# Patient Record
Sex: Female | Born: 1955 | Race: White | Hispanic: No | Marital: Single | Smoking: Former smoker
Health system: Southern US, Community
[De-identification: ages and names within clinical notes are randomized; demographics above are authoritative.]

## PROBLEM LIST (undated history)

## (undated) DIAGNOSIS — M549 Dorsalgia, unspecified: Secondary | ICD-10-CM

## (undated) DIAGNOSIS — F4310 Post-traumatic stress disorder, unspecified: Secondary | ICD-10-CM

## (undated) DIAGNOSIS — S199XXA Unspecified injury of neck, initial encounter: Secondary | ICD-10-CM

## (undated) DIAGNOSIS — F410 Panic disorder [episodic paroxysmal anxiety] without agoraphobia: Secondary | ICD-10-CM

## (undated) DIAGNOSIS — Z5900 Homelessness unspecified: Secondary | ICD-10-CM

## (undated) DIAGNOSIS — I2699 Other pulmonary embolism without acute cor pulmonale: Secondary | ICD-10-CM

## (undated) DIAGNOSIS — F419 Anxiety disorder, unspecified: Secondary | ICD-10-CM

## (undated) DIAGNOSIS — G4700 Insomnia, unspecified: Secondary | ICD-10-CM

## (undated) DIAGNOSIS — J189 Pneumonia, unspecified organism: Secondary | ICD-10-CM

## (undated) DIAGNOSIS — F1910 Other psychoactive substance abuse, uncomplicated: Secondary | ICD-10-CM

## (undated) HISTORY — PX: TONSILLECTOMY: SUR1361

---

## 2012-08-27 DIAGNOSIS — J189 Pneumonia, unspecified organism: Secondary | ICD-10-CM

## 2012-08-27 HISTORY — DX: Pneumonia, unspecified organism: J18.9

## 2013-08-27 DIAGNOSIS — I2699 Other pulmonary embolism without acute cor pulmonale: Secondary | ICD-10-CM

## 2013-08-27 HISTORY — DX: Other pulmonary embolism without acute cor pulmonale: I26.99

## 2014-09-27 LAB — HM MAMMOGRAPHY

## 2014-10-23 ENCOUNTER — Emergency Department: Payer: Self-pay | Admitting: Internal Medicine

## 2014-11-02 ENCOUNTER — Encounter (HOSPITAL_COMMUNITY): Payer: Self-pay | Admitting: Emergency Medicine

## 2014-11-02 ENCOUNTER — Emergency Department (HOSPITAL_COMMUNITY)
Admission: EM | Admit: 2014-11-02 | Discharge: 2014-11-02 | Disposition: A | Discharge status: Home / Self Care | Payer: Self-pay | Source: Home / Self Care | Attending: Family Medicine | Admitting: Family Medicine

## 2014-11-02 DIAGNOSIS — M6249 Contracture of muscle, multiple sites: Secondary | ICD-10-CM

## 2014-11-02 DIAGNOSIS — M62838 Other muscle spasm: Secondary | ICD-10-CM

## 2014-11-02 HISTORY — DX: Anxiety disorder, unspecified: F41.9

## 2014-11-02 HISTORY — DX: Unspecified injury of neck, initial encounter: S19.9XXA

## 2014-11-02 HISTORY — DX: Dorsalgia, unspecified: M54.9

## 2014-11-02 HISTORY — DX: Insomnia, unspecified: G47.00

## 2014-11-02 MED ORDER — CYCLOBENZAPRINE HCL 5 MG PO TABS: 5.0000 mg | ORAL_TABLET | Freq: Three times a day (TID) | ORAL | Status: DC | PRN

## 2014-11-02 NOTE — Discharge Instructions (Signed)
Thank you for coming in today. Come back or go to the emergency room if you notice new weakness new numbness problems walking or bowel or bladder problems. Follow up with Physical Therapy and [facility].  Muscle Cramps and Spasms Muscle cramps and spasms occur when a muscle or muscles tighten and you have no control over this tightening (involuntary muscle contraction). They are a common problem and can develop in any muscle. The most common place is in the calf muscles of the leg. Both muscle cramps and muscle spasms are involuntary muscle contractions, but they also have differences:   Muscle cramps are sporadic and painful. They may last a few seconds to a quarter of an hour. Muscle cramps are often more forceful and last longer than muscle spasms.  Muscle spasms may or may not be painful. They may also last just a few seconds or much longer. CAUSES  It is uncommon for cramps or spasms to be due to a serious underlying problem. In many cases, the cause of cramps or spasms is unknown. Some common causes are:   Overexertion.   Overuse from repetitive motions (doing the same thing over and over).   Remaining in a certain position for a long period of time.   Improper preparation, form, or technique while performing a sport or activity.   Dehydration.   Injury.   Side effects of some medicines.   Abnormally low levels of the salts and ions in your blood (electrolytes), especially potassium and calcium. This could happen if you are taking water pills (diuretics) or you are pregnant.  Some underlying medical problems can make it more likely to develop cramps or spasms. These include, but are not limited to:   Diabetes.   Parkinson disease.   Hormone disorders, such as thyroid problems.   Alcohol abuse.   Diseases specific to muscles, joints, and bones.   Blood vessel disease where not enough blood is getting to the muscles.  HOME CARE INSTRUCTIONS   Stay  well hydrated. Drink enough water and fluids to keep your urine clear or pale yellow.  It may be helpful to massage, stretch, and relax the affected muscle.  For tight or tense muscles, use a warm towel, heating pad, or hot shower water directed to the affected area.  If you are sore or have pain after a cramp or spasm, applying ice to the affected area may relieve discomfort.  Put ice in a plastic bag.  Place a towel between your skin and the bag.  Leave the ice on for 15-20 minutes, 03-04 times a day.  Medicines used to treat a known cause of cramps or spasms may help reduce their frequency or severity. Only take over-the-counter or prescription medicines as directed by your caregiver. SEEK MEDICAL CARE IF:  Your cramps or spasms get more severe, more frequent, or do not improve over time.  MAKE SURE YOU:   Understand these instructions.  Will watch your condition.  Will get help right away if you are not doing well or get worse. Document Released: 02/02/2002 Document Revised: 12/08/2012 Document Reviewed: 07/30/2012 [facility] Patient Information 2015 [employer] [place]. This information is not intended to replace advice given to you by your health care provider. Make sure you discuss any questions you have with your health care provider.
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
## 2014-11-16 ENCOUNTER — Emergency Department: Payer: Self-pay | Admitting: Emergency Medicine

## 2014-11-27 ENCOUNTER — Encounter (HOSPITAL_COMMUNITY): Payer: Self-pay | Admitting: Emergency Medicine

## 2014-11-27 ENCOUNTER — Emergency Department (HOSPITAL_COMMUNITY)
Admission: EM | Admit: 2014-11-27 | Discharge: 2014-11-27 | Discharge status: Against Medical Advice | Payer: Medicaid Other | Attending: Emergency Medicine | Admitting: Emergency Medicine

## 2014-11-27 DIAGNOSIS — Y998 Other external cause status: Secondary | ICD-10-CM | POA: Diagnosis not present

## 2014-11-27 DIAGNOSIS — Z86711 Personal history of pulmonary embolism: Secondary | ICD-10-CM | POA: Diagnosis not present

## 2014-11-27 DIAGNOSIS — Y9389 Activity, other specified: Secondary | ICD-10-CM | POA: Insufficient documentation

## 2014-11-27 DIAGNOSIS — S6991XA Unspecified injury of right wrist, hand and finger(s), initial encounter: Secondary | ICD-10-CM | POA: Diagnosis not present

## 2014-11-27 DIAGNOSIS — S8992XA Unspecified injury of left lower leg, initial encounter: Secondary | ICD-10-CM | POA: Diagnosis present

## 2014-11-27 DIAGNOSIS — S6992XA Unspecified injury of left wrist, hand and finger(s), initial encounter: Secondary | ICD-10-CM | POA: Diagnosis not present

## 2014-11-27 DIAGNOSIS — Z8669 Personal history of other diseases of the nervous system and sense organs: Secondary | ICD-10-CM | POA: Diagnosis not present

## 2014-11-27 DIAGNOSIS — Z720 Tobacco use: Secondary | ICD-10-CM | POA: Diagnosis not present

## 2014-11-27 DIAGNOSIS — W19XXXA Unspecified fall, initial encounter: Secondary | ICD-10-CM

## 2014-11-27 DIAGNOSIS — Y92019 Unspecified place in single-family (private) house as the place of occurrence of the external cause: Secondary | ICD-10-CM | POA: Diagnosis not present

## 2014-11-27 DIAGNOSIS — Z8659 Personal history of other mental and behavioral disorders: Secondary | ICD-10-CM | POA: Diagnosis not present

## 2014-11-27 DIAGNOSIS — S80212A Abrasion, left knee, initial encounter: Secondary | ICD-10-CM | POA: Diagnosis not present

## 2014-11-27 HISTORY — DX: Other pulmonary embolism without acute cor pulmonale: I26.99

## 2014-11-27 MED ORDER — LORAZEPAM 2 MG/ML IJ SOLN: 1.0000 mg | Freq: Once | INTRAMUSCULAR | Status: DC

## 2014-11-27 MED ORDER — CLONAZEPAM 0.5 MG PO TABS: 1.0000 mg | ORAL_TABLET | Freq: Once | ORAL | Status: DC

## 2014-11-27 MED ORDER — CLONAZEPAM 0.5 MG PO TABS
1.0000 mg | ORAL_TABLET | Freq: Once | ORAL | Status: AC
  Administered 2014-11-27: 1 mg via ORAL
  Filled 2014-11-27: qty 2

## 2014-11-27 MED ORDER — LORAZEPAM 1 MG PO TABS: 1.0000 mg | ORAL_TABLET | Freq: Once | ORAL | Status: DC

## 2014-11-27 NOTE — Progress Notes (Addendum)
ED CM received [facility] patient, reviewing record, ED CSW consulted regarding patient's complaints.

## 2014-11-27 NOTE — ED Notes (Signed)
Pt refusing xray, states "i've had too many xrays in my life".  Dr Rubin Payorpickering notified.

## 2014-11-27 NOTE — ED Notes (Signed)
Pt yelling at staff, threatening to leave, attempting to leave room.  Pt repeatedly stating that we are not helping her.  Told patient that we were trying to help her, that we had notified social work of her status, that we were trying to xray her, that she was refusing treatment and that we were therefore unable to help her.
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
## 2014-11-27 NOTE — ED Notes (Signed)
Social worker Simonne ComeLeo notified of patient complaint.  Leo at bedside at this time
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
## 2014-11-27 NOTE — ED Notes (Signed)
Pt continues to yell at staff, but agrees to have xrays performed.  Dr Rubin Payorpickering notified
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
## 2014-11-27 NOTE — ED Notes (Signed)
ASSESSMENT: Pt awake, alert, oriented.  Tearful, but coherent.   Reports generalized pain "everywhere".  Will not articulate particular area of pain.  Pt flinches and moans upon contact with any part of body.   Patient noted to have abrasions to left knee and to bilateral hands. No other bruises, abrasions noted at this time.   Pt noted to have clear breath sounds, oxygen level 98% Pt blood pressure noted to be 117/78. Pt reassured of safety, will notify social work.

## 2014-11-27 NOTE — ED Notes (Signed)
Went into room to assess patient, pt and belongings no longer in room.
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
## 2014-11-28 ENCOUNTER — Emergency Department (HOSPITAL_COMMUNITY)
Admission: EM | Admit: 2014-11-28 | Discharge: 2014-11-29 | Disposition: A | Discharge status: Home / Self Care | Payer: Medicaid Other | Attending: Emergency Medicine | Admitting: Emergency Medicine

## 2014-11-28 ENCOUNTER — Encounter (HOSPITAL_COMMUNITY): Payer: Self-pay | Admitting: *Deleted

## 2014-11-28 DIAGNOSIS — Z8669 Personal history of other diseases of the nervous system and sense organs: Secondary | ICD-10-CM | POA: Diagnosis not present

## 2014-11-28 DIAGNOSIS — Z720 Tobacco use: Secondary | ICD-10-CM | POA: Insufficient documentation

## 2014-11-28 DIAGNOSIS — Z86711 Personal history of pulmonary embolism: Secondary | ICD-10-CM | POA: Insufficient documentation

## 2014-11-28 DIAGNOSIS — F1210 Cannabis abuse, uncomplicated: Secondary | ICD-10-CM | POA: Diagnosis not present

## 2014-11-28 DIAGNOSIS — F39 Unspecified mood [affective] disorder: Secondary | ICD-10-CM | POA: Insufficient documentation

## 2014-11-28 DIAGNOSIS — Z87828 Personal history of other (healed) physical injury and trauma: Secondary | ICD-10-CM | POA: Insufficient documentation

## 2014-11-28 DIAGNOSIS — Z008 Encounter for other general examination: Secondary | ICD-10-CM | POA: Diagnosis present

## 2014-11-28 DIAGNOSIS — G8929 Other chronic pain: Secondary | ICD-10-CM | POA: Diagnosis not present

## 2014-11-28 DIAGNOSIS — R45851 Suicidal ideations: Secondary | ICD-10-CM

## 2014-11-28 DIAGNOSIS — F419 Anxiety disorder, unspecified: Secondary | ICD-10-CM | POA: Insufficient documentation

## 2014-11-28 DIAGNOSIS — F1310 Sedative, hypnotic or anxiolytic abuse, uncomplicated: Secondary | ICD-10-CM | POA: Insufficient documentation

## 2014-11-28 LAB — COMPREHENSIVE METABOLIC PANEL
ALK PHOS: 180 U/L — AB (ref 39–117)
ALT: 163 U/L — AB (ref 0–35)
AST: 100 U/L — AB (ref 0–37)
Albumin: 4.4 g/dL (ref 3.5–5.2)
Anion gap: 11 (ref 5–15)
BUN: 17 mg/dL (ref 6–23)
CO2: 25 mmol/L (ref 19–32)
Calcium: 9.6 mg/dL (ref 8.4–10.5)
Chloride: 104 mmol/L (ref 96–112)
Creatinine, Ser: 0.89 mg/dL (ref 0.50–1.10)
GFR calc Af Amer: 80 mL/min — ABNORMAL LOW (ref >90)
GFR, EST NON AFRICAN AMERICAN: 69 mL/min — AB (ref >90)
GLUCOSE: 166 mg/dL — AB (ref 70–99)
POTASSIUM: 3.9 mmol/L (ref 3.5–5.1)
SODIUM: 140 mmol/L (ref 135–145)
Total Bilirubin: 0.8 mg/dL (ref 0.3–1.2)
Total Protein: 7.7 g/dL (ref 6.0–8.3)

## 2014-11-28 LAB — ACETAMINOPHEN LEVEL: Acetaminophen (Tylenol), Serum: <10 ug/mL — ABNORMAL LOW (ref 10–30)

## 2014-11-28 LAB — SALICYLATE LEVEL: Salicylate Lvl: <4 mg/dL (ref 2.8–20.0)

## 2014-11-28 LAB — CBC
HCT: 42.9 % (ref 36.0–46.0)
HEMOGLOBIN: 14.6 g/dL (ref 12.0–15.0)
MCH: 30.7 pg (ref 26.0–34.0)
MCHC: 34 g/dL (ref 30.0–36.0)
MCV: 90.3 fL (ref 78.0–100.0)
PLATELETS: 181 10*3/uL (ref 150–400)
RBC: 4.75 MIL/uL (ref 3.87–5.11)
RDW: 14 % (ref 11.5–15.5)
WBC: 5.6 10*3/uL (ref 4.0–10.5)

## 2014-11-28 LAB — RAPID URINE DRUG SCREEN, HOSP PERFORMED
Amphetamines: NOT DETECTED
BARBITURATES: NOT DETECTED
BENZODIAZEPINES: POSITIVE — AB
Cocaine: NOT DETECTED
Opiates: NOT DETECTED
TETRAHYDROCANNABINOL: POSITIVE — AB

## 2014-11-28 LAB — ETHANOL

## 2014-11-28 MED ORDER — STERILE WATER FOR INJECTION IJ SOLN
INTRAMUSCULAR | Status: AC
  Filled 2014-11-28: qty 10

## 2014-11-28 MED ORDER — ALUM & MAG HYDROXIDE-SIMETH 200-200-20 MG/5ML PO SUSP: 30.0000 mL | ORAL | Status: DC | PRN

## 2014-11-28 MED ORDER — ACETAMINOPHEN 325 MG PO TABS: 650.0000 mg | ORAL_TABLET | ORAL | Status: DC | PRN

## 2014-11-28 MED ORDER — ONDANSETRON HCL 4 MG PO TABS: 4.0000 mg | ORAL_TABLET | Freq: Three times a day (TID) | ORAL | Status: DC | PRN

## 2014-11-28 MED ORDER — HALOPERIDOL 2 MG PO TABS
2.0000 mg | ORAL_TABLET | Freq: Once | ORAL | Status: DC
  Filled 2014-11-28: qty 1

## 2014-11-28 MED ORDER — ZIPRASIDONE MESYLATE 20 MG IM SOLR
10.0000 mg | Freq: Once | INTRAMUSCULAR | Status: AC
  Administered 2014-11-28: 10 mg via INTRAMUSCULAR
  Filled 2014-11-28: qty 20

## 2014-11-28 MED ORDER — NICOTINE 21 MG/24HR TD PT24
21.0000 mg | MEDICATED_PATCH | Freq: Every day | TRANSDERMAL | Status: DC
  Filled 2014-11-28: qty 1

## 2014-11-28 MED ORDER — TRAZODONE HCL 100 MG PO TABS
100.0000 mg | ORAL_TABLET | Freq: Every evening | ORAL | Status: DC | PRN
  Filled 2014-11-28: qty 1

## 2014-11-28 MED ORDER — LORAZEPAM 1 MG PO TABS
1.0000 mg | ORAL_TABLET | Freq: Three times a day (TID) | ORAL | Status: DC | PRN
  Administered 2014-11-28 – 2014-11-29 (×2): 1 mg via ORAL
  Filled 2014-11-28 (×2): qty 1

## 2014-11-28 MED ORDER — HYDROXYZINE HCL 25 MG PO TABS
50.0000 mg | ORAL_TABLET | Freq: Four times a day (QID) | ORAL | Status: DC | PRN
  Administered 2014-11-28 – 2014-11-29 (×2): 50 mg via ORAL
  Filled 2014-11-28 (×2): qty 2

## 2014-11-28 NOTE — ED Notes (Signed)
Resting with eyes closed. Acuity low.

## 2014-11-28 NOTE — ED Notes (Signed)
Patient at nurses station arguing that Vistaril is not helping. Reminded that she only took the medication PO 15 minutes ago. Offered patient distraction tools and encouragement. Patient reports nothing will help but her medications and refuses to try anything. Patient returned to room yelling and slamming doors.   Q 15 safety checks continue.

## 2014-11-28 NOTE — ED Notes (Signed)
Patient irritable and arguementative.  Acuity moderate.

## 2014-11-28 NOTE — ED Notes (Addendum)
Patient is irritable and demanding. Reports that Klonopin aids her anxiety and Restoril helps her sleep. Patient requests Ativan at present as "I am getting angry". Patient made aware of medication orders. Patient reports that receiving Geodon agitated her and made her want to harm others. Denies SI, HI, AVH at present.   Encouragement offered.   Q 15 safety checks in place.   Annette Montgomery. [place] NP made aware of patient status.
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
## 2014-11-28 NOTE — ED Notes (Signed)
Acuity note- Patient is angry and demanding.  Threw her dinner tray and drink across room.  Refused to clean it up. "Leave it there! I'm very angry!"  Initially declined ativan,demanding clonopin. Then, took ativan.Refused to put scrubs on. Laying with sheet covering her body.  Belligerent affect.
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
## 2014-11-28 NOTE — ED Notes (Signed)
After speaking with Doran StablerNwaeze NP and Clinical research associatewriter, patient willing to try Vistaril. Remains argumentative, irritable.   Q 15 safety checks continue.

## 2014-11-28 NOTE — ED Notes (Signed)
Informed patient that she would be able to discuss her medications in the am with provider. Continues to be demanding.  Compassionate boundaries enforced.

## 2014-11-28 NOTE — ED Notes (Addendum)
Patient has jean jacket, black tank top, jeans, leggings, black socks, boots and one shoelace necklace. Items are in 2 patient belonging bags. Patient has been wanded by security. Belongings are at nursing station by patients room.   Patient is verbally abusive to staff and yelling at staff members to get out of her room.

## 2014-11-28 NOTE — ED Notes (Signed)
Patient offered Vistaril and Trazodone for c/o anxiety, agitation, and insomnia. Patient refuses medications at present. States she does not like Trazodone.   Encouragement offered. Water provided.  Q 15 safety checks continue.

## 2014-11-28 NOTE — ED Notes (Signed)
Patient at nurses station. Requesting medication and that a provider be called. Reports feelings of anxiety attack. Continues to refuse to try Vistaril and/or Trazodone to try and help her relax and get to sleep.   Nwaeze NP made aware.

## 2014-11-28 NOTE — ED Notes (Addendum)
EMS reports [facility] called EMS due to pt having a :"Panic Attack" pt did receive morning dose of Methadone, she is presently crying and will not stop. VS 104-150/90 CBG 154. Pt stated to EMS multiple times "I don't want to be hear and want to die."

## 2014-11-28 NOTE — ED Notes (Signed)
Bed: [id number] Expected date: 11/28/14 Expected time: 8:23 AM Means of arrival: Ambulance Comments: Medical Clearance S/I

## 2014-11-29 DIAGNOSIS — F39 Unspecified mood [affective] disorder: Secondary | ICD-10-CM | POA: Diagnosis present

## 2014-11-29 MED ORDER — TEMAZEPAM 15 MG PO CAPS
30.0000 mg | ORAL_CAPSULE | Freq: Once | ORAL | Status: AC
  Administered 2014-11-29: 30 mg via ORAL
  Filled 2014-11-29: qty 2

## 2014-11-29 NOTE — ED Notes (Signed)
Patient reports that she placed Ativan in her water and then poured it in the sink.

## 2014-11-29 NOTE — ED Notes (Signed)
Currently calm and cooperative. Hopeful for D/C. Safety has been maintained with q15 min sfaty checks. Will continue current POC. Acuity moderate

## 2014-11-29 NOTE — ED Notes (Signed)
Patient irritable, argumentative. Wants pain medication other than Tylenol. NP speaking with patient.

## 2014-11-29 NOTE — Progress Notes (Signed)
CM spoke with pt who [place]. CM discussed and provided written information for self pay pcps, importance of pcp for f/u care, www.needymeds.org, www.goodrx.com, discounted pharmacies and other [place] resources such as [facility], [facility], affordable care act,  financial assistance, DSS and  health department  Reviewed resources for [place] self pay pcps like [facility], family medicine at [address], [facility] family [facility], general medical clinics, [facility] urgent care plus others, medication resources, [facility] out patient pharmacies and housing Pt voiced understanding and appreciation of resources provided   Provided [facility] contact information Pt [place] Pt encouraged to access [facility] after [place]  Pt also given information on open door ministries [place]
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
## 2014-11-29 NOTE — [facility] Suicide Risk Assessment (Cosign Needed)
Suicide Risk Assessment  Discharge Assessment   [facility]   Demographic Factors:  Caucasian, Low socioeconomic status and Unemployed  Total Time spent with patient: 20 minutes  Musculoskeletal: Strength & Muscle Tone: within normal limits Gait & Station: normal Patient leans: N/A  Psychiatric Specialty Exam:     Blood pressure 103/63, pulse 78, temperature 98.3 F (36.8 C), temperature source Oral, resp. rate 16, SpO2 100 %.There is no height or weight on file to calculate BMI.  General Appearance: Casual and Disheveled  Eye Contact::  Good  Speech:  Clear and Coherent and Normal Rate409  Volume:  Normal  Mood:  Angry and Anxious  Affect:  Congruent  Thought Process:  Coherent, Goal Directed and Intact  Orientation:  Full (Time, Place, and Person)  Thought Content:  WDL  Suicidal Thoughts:  No  Homicidal Thoughts:  No  Memory:  Immediate;   Good Recent;   Good Remote;   Good  Judgement:  Fair  Insight:  Good  Psychomotor Activity:  Normal  Concentration:  Good  Recall:  NA  Fund of Knowledge:Fair  Language: Good  Akathisia:  NA  Handed:  Right  AIMS (if indicated):     Assets:  Communication Skills Desire for Improvement  Sleep:     Cognition: WNL  ADL's:  Impaired      Has this patient used any form of tobacco in the last 30 days? (Cigarettes, Smokeless Tobacco, Cigars, and/or Pipes) Yes, A prescription for an FDA-approved tobacco cessation medication was offered at discharge and the patient refused  Mental Status Per Nursing Assessment::   On Admission:     Current Mental Status by Physician: NA  Loss Factors: Financial problems/change in socioeconomic status  Historical Factors: NA  Risk Reduction Factors:   NA  Continued Clinical Symptoms:  Severe Anxiety and/or Agitation  Cognitive Features That Contribute To Risk:  Polarized thinking    Suicide Risk:  Minimal: No identifiable suicidal ideation.  Patients  presenting with no risk factors but with morbid ruminations; may be classified as minimal risk based on the severity of the depressive symptoms  Principal Problem: Mood disorder Discharge Diagnoses:  Patient Active Problem List   Diagnosis Date Noted  . Mood disorder [F39] 11/29/2014    Priority: High      Plan Of Care/Follow-up recommendations:  Activity:  AS TOLERATED Diet:  REGULAR  Is patient on multiple antipsychotic therapies at discharge:  No   Has Patient had three or more failed trials of antipsychotic monotherapy by history:  No  Recommended Plan for Multiple Antipsychotic Therapies: NA    Symphoni Helbling C   PMHNP-BC 11/29/2014, 1:35 PM

## 2014-11-29 NOTE — ED Notes (Signed)
Patient up, asking for methadone. Reminded that psychiatry would round around 9am.

## 2014-11-29 NOTE — ED Notes (Signed)
Patient awake stating medication given did not work.

## 2014-11-29 NOTE — ED Notes (Signed)
Patient at nurses station again asking for methadone. When told again when psychiatry would be rounding, patient then asked for Xanax. Advised that this was not ordered for her. Patient then asked for Ativan. Advised that this was ordered Q8. Patient declines offer of Vistaril. Encouraged to try to calm herself and rest on her own.

## 2014-11-29 NOTE — ED Notes (Signed)
Patient requesting medication at 2335. Offered prn Ativan. Patient refuses, asks that I speak to provider about Klonopin.   After speaking with NP, writer returned to patient's room. Patient sleeping; eyes closed respirations even and unlabored. Will update patient when she wakes.  Q 15 safety checks in place.

## 2014-11-29 NOTE — ED Notes (Addendum)
Patient is being argumentative, irritable. Upset that she is not ordered Klonopin. States Ativan does not work. Then requested Ativan. Later states "You can have Ativan discontinued after this". Patient then made remark that she poured water with Ativan down the sink.   At nurses station reporting "I have fever and chills now". States that this is related to her panic attack and headache.

## 2014-11-29 NOTE — Progress Notes (Signed)
Per psychiatrist patient psychiatrically stable for discharge home. CSW was consulted for possible resources. Pt approached csw in the hallway, ask if this Clinical research associatewriter was Child psychotherapistsocial worker. I replied yes. Pt stated, "I was going to talk to you, but I wanted to let you know I have it all figured out, and do not wish to talk to you." CSW discussed with rn and providers. No further Clinical Social Work needs, signing off.   Annette CoasterKristen Keone Kamer, LCSW  Clinical Social Work  [facility] [phone]

## 2014-11-29 NOTE — ED Notes (Signed)
Writer attempted to provide prn to pt, but pt demanded other prn. Writer reinforced need to try hydroxyzine first and then try ativan an hour after if Vistaril not effective. Pt attempted to hold meds in hand and fake taking them. Writer pointed out that he saw what she was attempting and asked her to take them or give them back. She then put them in her mouth and spit them at Emerson Electricwriter. When writer returned to retrieve the meds, she picked them up and took them. As Clinical research associatewriter walked away, he could hear her gagging herself, but did not witness her vomit. She later angrily presented the meds to writer stating, "Just so we are even, here is that Vistaril.". Writer reinforced limits. Safety has been maintained with q15 min obs. Will continue current POC. Acuity moderate.

## 2014-12-06 ENCOUNTER — Emergency Department (HOSPITAL_COMMUNITY)
Admission: EM | Admit: 2014-12-06 | Discharge: 2014-12-06 | Disposition: A | Discharge status: Home / Self Care | Payer: Medicaid Other | Attending: Emergency Medicine | Admitting: Emergency Medicine

## 2014-12-06 ENCOUNTER — Encounter (HOSPITAL_COMMUNITY): Payer: Self-pay | Admitting: Family Medicine

## 2014-12-06 DIAGNOSIS — Z760 Encounter for issue of repeat prescription: Secondary | ICD-10-CM

## 2014-12-06 DIAGNOSIS — Z86711 Personal history of pulmonary embolism: Secondary | ICD-10-CM | POA: Diagnosis not present

## 2014-12-06 DIAGNOSIS — G479 Sleep disorder, unspecified: Secondary | ICD-10-CM | POA: Insufficient documentation

## 2014-12-06 DIAGNOSIS — Z8659 Personal history of other mental and behavioral disorders: Secondary | ICD-10-CM | POA: Diagnosis not present

## 2014-12-06 DIAGNOSIS — G4700 Insomnia, unspecified: Secondary | ICD-10-CM | POA: Insufficient documentation

## 2014-12-06 DIAGNOSIS — Z720 Tobacco use: Secondary | ICD-10-CM | POA: Diagnosis not present

## 2014-12-06 MED ORDER — TEMAZEPAM 30 MG PO CAPS: 30.0000 mg | ORAL_CAPSULE | Freq: Every evening | ORAL | Status: DC | PRN

## 2014-12-06 NOTE — ED Notes (Signed)
Pt here for refill of Restoril. sts she hasn't slept in 1 week.

## 2014-12-06 NOTE — ED Notes (Signed)
Pt d/c'd from continuous pulse oximetry and blood pressure cuff; pt already dressed

## 2014-12-06 NOTE — ED Provider Notes (Signed)
CSN: [id number]     Arrival date & time 12/06/14  0753 History   First MD Initiated Contact with Patient 12/06/14 0757     Chief Complaint  Patient presents with  . Medication Refill     (Consider location/radiation/quality/duration/timing/severity/associated sxs/prior Treatment) HPI Comments: Patient is a 59 yo F PMHx significant for anxiety, insomnia, h/o PE, tobacco abuse presenting to the ED requesting a refill on her Restoril. She states she has not slept in a week since she has been out of her medication. She states she was not given a prescription by behavioral health at time of discharge from [facility] ED and was only given a three day supply by [facility]. Denies any SI, HI, hallucinations, self injury, CP, SOB, abdominal pain, HAs. No other complaints at this time.   Past Medical History  Diagnosis Date  . Anxiety   . Insomnia   . Neck injury   . Back pain   . Pulmonary embolism    History reviewed. No pertinent past surgical history. History reviewed. No pertinent family history. History  Substance Use Topics  . Smoking status: Heavy Tobacco Smoker -- 0.50 packs/day for 10 years    Types: Cigarettes  . Smokeless tobacco: Not on file  . Alcohol Use: No   OB History    No data available     Review of Systems  Psychiatric/Behavioral: Positive for sleep disturbance.  All other systems reviewed and are negative.     Allergies  Contrast media and Nsaids  Home Medications   Prior to Admission medications   Medication Sig Start Date End Date Taking? Authorizing Provider  cyclobenzaprine (FLEXERIL) 5 MG tablet Take 1 tablet (5 mg total) by mouth 3 (three) times daily as needed for muscle spasms. Patient not taking: Reported on 11/28/2014 11/02/14   Rodolph Bong, MD  temazepam (RESTORIL) 30 MG capsule Take 1 capsule (30 mg total) by mouth at bedtime as needed for sleep. 12/06/14   Kailly Richoux, PA-C   BP 165/98 mmHg  Pulse 68  Temp(Src) 99.4 F (37.4 C)  (Oral)  Resp 18  Ht  (1.702 m)  Wt 139 lb (63.05 kg)  BMI 21.77 kg/m2  SpO2 98% Physical Exam  Constitutional: She is oriented to person, place, and time. She appears well-developed and well-nourished. No distress.  HENT:  Head: Normocephalic and atraumatic.  Right Ear: External ear normal.  Left Ear: External ear normal.  Nose: Nose normal.  Mouth/Throat: Oropharynx is clear and moist.  Eyes: Conjunctivae and EOM are normal. Pupils are equal, round, and reactive to light.  Neck: Normal range of motion. Neck supple.  No nuchal rigidity.   Cardiovascular: Normal rate, regular rhythm and normal heart sounds.   Pulmonary/Chest: Effort normal and breath sounds normal. No respiratory distress.  Abdominal: Soft. There is no tenderness.  Musculoskeletal: Normal range of motion. She exhibits no edema.  Neurological: She is alert and oriented to person, place, and time.  Skin: Skin is warm and dry. She is not diaphoretic.  Psychiatric: She has a normal mood and affect.  Nursing note and vitals reviewed.   ED Course  Procedures (including critical care time) Medications - No data to display  Labs Review Labs Reviewed - No data to display  Imaging Review No results found.   EKG Interpretation None      MDM   Final diagnoses:  Encounter for medication refill    Filed Vitals:   12/06/14 0912  BP: 165/98  Pulse: 68  Temp:   Resp: 18   Afebrile, NAD, non-toxic appearing, AAOx4.   Patient presenting to the ED requesting medication refill. No acute medical complaint at this time. No SI, HI, hallucinations, or other history or physical examination findings to suggest acute psychosis or medical condition at this time. Will provide limited refill for patient. Patient noted to be hypertensive in the emergency department.  No signs of hypertensive urgency.  Discussed with patient the need for close follow-up and management by their primary care physician. She was advised [facility]  [facility]. Patient is stable at time of discharge      Francee PiccoloJennifer Nekita Pita, PA-C 12/06/14 1117  Raeford RazorStephen Kohut, MD 12/06/14 [phone]

## 2014-12-06 NOTE — Discharge Instructions (Signed)
Please follow up with your primary care physician in 1-2 days. If you do not have one please call the [facility] number listed above. Please read all discharge instructions and return precautions.    Medication Refill, Emergency Department We have refilled your medication today as a courtesy to you. It is best for your medical care, however, to take care of getting refills done through your primary caregiver's office. They have your records and can do a better job of follow-up than we can in the emergency department. On maintenance medications, we often only prescribe enough medications to get you by until you are able to see your regular caregiver. This is a more expensive way to refill medications. In the future, please plan for refills so that you will not have to use the emergency department for this. Thank you for your help. Your help allows us to better take care of the daily emergencies that enter our department. Document Released: 11/30/2003 Document Revised: 11/05/2011 Document Reviewed: 11/20/2013 [facility] Patient Information 2015 [employer] [place]. This information is not intended to replace advice given to you by your health care provider. Make sure you discuss any questions you have with your health care provider.

## 2014-12-15 ENCOUNTER — Ambulatory Visit: Payer: Medicaid Other | Admitting: Internal Medicine

## 2014-12-19 ENCOUNTER — Emergency Department (HOSPITAL_COMMUNITY)
Admission: EM | Admit: 2014-12-19 | Discharge: 2014-12-19 | Disposition: A | Discharge status: Home / Self Care | Payer: Medicaid Other | Attending: Emergency Medicine | Admitting: Emergency Medicine

## 2014-12-19 ENCOUNTER — Encounter (HOSPITAL_COMMUNITY): Payer: Self-pay | Admitting: *Deleted

## 2014-12-19 DIAGNOSIS — G4700 Insomnia, unspecified: Secondary | ICD-10-CM | POA: Insufficient documentation

## 2014-12-19 DIAGNOSIS — Z8659 Personal history of other mental and behavioral disorders: Secondary | ICD-10-CM | POA: Insufficient documentation

## 2014-12-19 DIAGNOSIS — F99 Mental disorder, not otherwise specified: Secondary | ICD-10-CM | POA: Insufficient documentation

## 2014-12-19 DIAGNOSIS — Z86711 Personal history of pulmonary embolism: Secondary | ICD-10-CM | POA: Diagnosis not present

## 2014-12-19 DIAGNOSIS — Z720 Tobacco use: Secondary | ICD-10-CM | POA: Diagnosis not present

## 2014-12-19 DIAGNOSIS — M79605 Pain in left leg: Secondary | ICD-10-CM | POA: Insufficient documentation

## 2014-12-19 DIAGNOSIS — M79604 Pain in right leg: Secondary | ICD-10-CM | POA: Diagnosis not present

## 2014-12-19 DIAGNOSIS — R5383 Other fatigue: Secondary | ICD-10-CM | POA: Diagnosis present

## 2014-12-19 DIAGNOSIS — M79606 Pain in leg, unspecified: Secondary | ICD-10-CM

## 2014-12-19 HISTORY — DX: Panic disorder (episodic paroxysmal anxiety): F41.0

## 2014-12-19 HISTORY — DX: Post-traumatic stress disorder, unspecified: F43.10

## 2014-12-19 HISTORY — DX: Other psychoactive substance abuse, uncomplicated: F19.10

## 2014-12-19 LAB — CBC WITH DIFFERENTIAL/PLATELET
Basophils Absolute: 0 10*3/uL (ref 0.0–0.1)
Basophils Relative: 1 % (ref 0–1)
EOS PCT: 2 % (ref 0–5)
Eosinophils Absolute: 0.2 10*3/uL (ref 0.0–0.7)
HEMATOCRIT: 39.3 % (ref 36.0–46.0)
HEMOGLOBIN: 13 g/dL (ref 12.0–15.0)
Lymphocytes Relative: 57 % — ABNORMAL HIGH (ref 12–46)
Lymphs Abs: 4.2 10*3/uL — ABNORMAL HIGH (ref 0.7–4.0)
MCH: 29.9 pg (ref 26.0–34.0)
MCHC: 33.1 g/dL (ref 30.0–36.0)
MCV: 90.3 fL (ref 78.0–100.0)
Monocytes Absolute: 0.7 10*3/uL (ref 0.1–1.0)
Monocytes Relative: 9 % (ref 3–12)
NEUTROS ABS: 2.3 10*3/uL (ref 1.7–7.7)
Neutrophils Relative %: 31 % — ABNORMAL LOW (ref 43–77)
Platelets: 146 10*3/uL — ABNORMAL LOW (ref 150–400)
RBC: 4.35 MIL/uL (ref 3.87–5.11)
RDW: 14.2 % (ref 11.5–15.5)
WBC: 7.3 10*3/uL (ref 4.0–10.5)

## 2014-12-19 LAB — COMPREHENSIVE METABOLIC PANEL
ALT: 180 U/L — ABNORMAL HIGH (ref 0–35)
AST: 103 U/L — AB (ref 0–37)
Albumin: 3.8 g/dL (ref 3.5–5.2)
Alkaline Phosphatase: 201 U/L — ABNORMAL HIGH (ref 39–117)
Anion gap: 11 (ref 5–15)
BILIRUBIN TOTAL: 0.7 mg/dL (ref 0.3–1.2)
BUN: 14 mg/dL (ref 6–23)
CHLORIDE: 102 mmol/L (ref 96–112)
CO2: 24 mmol/L (ref 19–32)
CREATININE: 1.01 mg/dL (ref 0.50–1.10)
Calcium: 9 mg/dL (ref 8.4–10.5)
GFR calc Af Amer: 69 mL/min — ABNORMAL LOW (ref >90)
GFR calc non Af Amer: 59 mL/min — ABNORMAL LOW (ref >90)
Glucose, Bld: 65 mg/dL — ABNORMAL LOW (ref 70–99)
Potassium: 3.8 mmol/L (ref 3.5–5.1)
SODIUM: 137 mmol/L (ref 135–145)
Total Protein: 6.9 g/dL (ref 6.0–8.3)

## 2014-12-19 MED ORDER — LORAZEPAM 1 MG PO TABS
1.0000 mg | ORAL_TABLET | Freq: Once | ORAL | Status: DC
  Filled 2014-12-19: qty 1

## 2014-12-19 NOTE — ED Notes (Addendum)
Pt reports falling 7 times and that she is in "pain all over."  Pt states she has been walking all night and can't see in the dark.  Pt cannot tell this RN where she came from or how long she has been walking.  Pt is resistant to cooperating with staff.  Pt states we "are not helping her no matter what you think."  Pt reports she came to the ER because she keeps "fainting, can't eat, can't get into a shelter, maybe because my legs are so messed up."  Pt states she wants something for her pain but will not take any tylenol.  Pt wants treatment here in the ED to be "leaving me alone and let me rest and eat."  When this RN continued to ask what pt was needing besides rest, pt refused to give me an answer, "huffing" at me.  Pt reports she was in a "hangliding accident that caused her neck pain which she was prescribed methadone.  I'm weaning myself off of it because it's not healthy for me." Pt believes she is currently taking 60 mg, but that "one day it's up and one day it's down."  It denies wanting detox from methadone at this time.  Pt keeps her eyes shut tight during conversation.

## 2014-12-19 NOTE — Progress Notes (Addendum)
CSW and RN met with this 59 y/o, Caucasian, Female, who presents in hospital garb with complaints of fatigue.  Patient's affect is lethargic and agitated, mood is "tired and hungry," presents unkempt, poor hygiene, speech goes from low to high, not always coherent.  Thought process is difficult to judge, judgement and insight are poor.  Patient denies S/I and H/I.  Patient does state she has been diagnosed with PTSD and anxiety.  When asked what the trauma was, "I don't know probably from 33 of my family members dying in a 2 year time span."   Patient states she has been, "walking the street for four days without anything to eat.  I am tired and hungry."  Patient states she has been taking Restoril and Methadone.  Restoril was prescribed in [place] and the Methadone is from the [facility] in [place].  Patient states she took her Sunday take home dose this morning, after asking multiple times the patient stated she believes she is taking 39m of Methadone.  "It goes up and down, I don't know.  It barely works for me.  I am always in pain.  I have pain from my neck, I was in a hang gliding accident a few years ago."  [place].  However, he lives with his mother and when she arrived she was told to leave.  Patient has been to the ED twice since 27 November 2014.  Considering that patient [place] the country without a plan, is walking the streets alone, not sleeping, is anxious, agitated, making poor choices, ruling out a mood disorder or possible manic episode might be appropriate.  Patient also appears to be dependent on Opioids and Sedatives.  CSW attempted to contact her spouse, but the number went to a retail company and the voicemail was full.  CSW did call DSS to start an APS report about this patient.  CSW and Nurse discussed patient with Attending.  CSW is available as needed.  [facility] Priest[facility]  CSW [phone]

## 2014-12-19 NOTE — ED Notes (Signed)
Pt has begun to remove all of her belongings from her bags, crying stating "I have nothing.  All my clothes.  All my medicine.  I haven't had anything. I've been trying to get all of my papers together.  I don't have anything.  You don't understand.  You don't understand."

## 2014-12-19 NOTE — ED Provider Notes (Signed)
CSN: [id number]     Arrival date & time 12/19/14  1325 History   First MD Initiated Contact with Patient 12/19/14 1600     Chief Complaint  Patient presents with  . Fatigue     HPI  Patient presents with request for help with pain, and unclear other complaints. Patient states that she is homeless, is in pain all over, from not being able to rest anywhere. Patient states that after recent referral shelter, she did not go stay there. He states that in particular over the past 4 days she's been walking and mostly, has had multiple mechanical falls. She states that she has diffuse bilateral lower extremity pain, soreness, but no loss of function or sensation. No current chest pain or headache. Patient acknowledges multiple medical issues, including hypertension, anxiety, denies other psychiatric diagnoses, such as bipolar disorder, mania.  Past Medical History  Diagnosis Date  . Anxiety   . Insomnia   . Neck injury   . Back pain   . Pulmonary embolism   . PTSD (post-traumatic stress disorder)   . Panic attacks   . Polysubstance abuse    History reviewed. No pertinent past surgical history. History reviewed. No pertinent family history. History  Substance Use Topics  . Smoking status: Heavy Tobacco Smoker -- 0.50 packs/day for 10 years    Types: Cigarettes  . Smokeless tobacco: Not on file  . Alcohol Use: No   OB History    No data available     Review of Systems  Unable to perform ROS: Psychiatric disorder      Allergies  Contrast media and Nsaids  Home Medications   Prior to Admission medications   Medication Sig Start Date End Date Taking? Authorizing Provider  cyclobenzaprine (FLEXERIL) 5 MG tablet Take 1 tablet (5 mg total) by mouth 3 (three) times daily as needed for muscle spasms. Patient not taking: Reported on 11/28/2014 11/02/14   Rodolph Bong, MD  temazepam (RESTORIL) 30 MG capsule Take 1 capsule (30 mg total) by mouth at bedtime as needed for sleep. 12/06/14    Jennifer Piepenbrink, PA-C   BP 122/68 mmHg  Pulse 55  Temp(Src) 97.8 F (36.6 C) (Oral)  Resp 18  SpO2 97% Physical Exam  Constitutional: She is oriented to person, place, and time. She appears well-developed and well-nourished. No distress.  Disheveled, elderly female intermittently interactive, falls asleep quickly.   HENT:  Head: Normocephalic and atraumatic.  Eyes: Conjunctivae and EOM are normal.  Pulmonary/Chest: Effort normal. No stridor. No respiratory distress.  Abdominal: She exhibits no distension.  Musculoskeletal: She exhibits no edema.  Both lower extremities have multiple ecchymotic patches consistent with minor trauma. No gross deformity of either extremity, patient moves both extremity spontaneously, they are symmetric in size.   Neurological: She is alert and oriented to person, place, and time. She displays no atrophy and no tremor. No cranial nerve deficit. She exhibits normal muscle tone. She displays no seizure activity.  Though the patient falls asleep quickly, when awake she is oriented appropriately, speaks clearly, has no facial asymmetry. Moves all extremity spontaneously.   Skin: Skin is warm and dry.  Psychiatric: Her affect is labile. Her speech is delayed and tangential. She is aggressive. Cognition and memory are impaired.  Nursing note and vitals reviewed.   ED Course  Procedures (including critical care time) Labs Review Labs Reviewed  COMPREHENSIVE METABOLIC PANEL - Abnormal; Notable for the following:    Glucose, Bld 65 (*)  AST 103 (*)    ALT 180 (*)    Alkaline Phosphatase 201 (*)    GFR calc non Af Amer 59 (*)    GFR calc Af Amer 69 (*)    All other components within normal limits  CBC WITH DIFFERENTIAL/PLATELET - Abnormal; Notable for the following:    Platelets 146 (*)    Neutrophils Relative % 31 (*)    Lymphocytes Relative 57 (*)    Lymphs Abs 4.2 (*)    All other components within normal limits  URINALYSIS, ROUTINE W  REFLEX MICROSCOPIC    After the patient's initial evaluation I reviewed the patient's chart, including recent evaluation here. I discussed her case with our Forensic scientistsocial worker colleagues. The patient has previously been referred to shelters, a patient treatment, has not followed through with any of those supplements.   Given the patient's description of issues pain after multiple pain medication, and the patient deferred all suggestions.   Patient awake and alert, moving all extremities. Vital signs unremarkable. Patient requests medication for her anxiety.  MDM   Patient presents with concern diffusely, particularly in her legs. The patient seemingly homeless, she has not followed through with prior referrals to shelters, and today the first offers for assistance with housing. She also defers offers for assistance with pain control. Patient is uncomfortable appearing, but in no distress. She denies any suicidal thoughts. She is not actively hallucinating. After some provision of anti-anxiety medication, per request, the patient was discharged with resources.    Gerhard Munchobert Taleeyah Bora, MD 12/19/14 [phone]

## 2014-12-19 NOTE — ED Notes (Addendum)
Pt crying and moaning, put on her clothes.  Requesting anxiety medication.  Notified Dr Jeraldine LootsLockwood.

## 2014-12-19 NOTE — Discharge Instructions (Signed)
As discussed, with your current living situation, and her medical issues it is very important to both follow up with a primary care physician, and to take advantage of the resources to obtain assistance with housing, and your life situation. Please use the provided resources.  Emergency Department Resource Guide 1) Find a Doctor and Pay Out of Pocket Although you won't have to find out who is covered by your insurance plan, it is a good idea to ask around and get recommendations. You will then need to call the office and see if the doctor you have chosen will accept you as a new patient and what types of options they offer for patients who are self-pay. Some doctors offer discounts or will set up payment plans for their patients who do not have insurance, but you will need to ask so you aren't surprised when you get to your appointment.  2) Contact Your Local Health Department Not all health departments have doctors that can see patients for sick visits, but many do, so it is worth a call to see if yours does. If you don't know where your local health department is, you can check in your phone book. The CDC also has a tool to help you locate your state's health department, and many state websites also have listings of all of their local health departments.  3) Find a Walk-in Clinic If your illness is not likely to be very severe or complicated, you may want to try a walk in clinic. These are popping up all over the country in pharmacies, drugstores, and shopping centers. They're usually staffed by nurse practitioners or physician assistants that have been trained to treat common illnesses and complaints. They're usually fairly quick and inexpensive. However, if you have serious medical issues or chronic medical problems, these are probably not your best option.  No Primary Care Doctor: - Call Health Connect at  [phone] - they can help you locate a primary care doctor that  accepts your insurance,  provides certain services, etc. - Physician Referral Service- [phone]  Chronic Pain Problems: Organization         Address     Phone             Notes  [facility]  [phone] Patients need to be referred by their primary care doctor.   Medication Assistance: Organization         Address     Phone             Notes  [facility] Medication [facility] [address] [place], [place] [address] [phone] --Must be a resident of [facility] -- Must have NO insurance coverage whatsoever (no Medicaid/ Medicare, etc.) -- The pt. MUST have a primary care doctor that directs their care regularly and follows them in the community   [facility]  [phone]   [employer]  [phone]    Agencies that provide inexpensive medical care: Organization         Address     Phone             Notes  [facility]  [phone]   [facility]    [phone]   [facility] [address] [place], [place] [address] [phone]   [facility] [address] [place] [phone]   Planned Parenthood    [phone]   [facility]    [phone]   [facility]  [address], [place] Phone:  [phone], Fax:  [phone] Hours of Operation:  9 am - 6 pm, M-F.  Also accepts Medicaid/Medicare and self-pay.  [facility]  [address], [place] Phone: [phone], Fax: [phone]. Hours of Operation:  8:30 am - 5:30 pm, M-F.  Also accepts Medicaid and self-pay.  [facility] [place] [address] [place] Phone: [phone]   [facility]     [address] Natasha Bence [place], [place] [phone], Ext. [phone] Mondays & Thursdays: 7-9 AM.  First 15 patients are seen on a first come, first serve basis.   [facility] [address], [place] [address] [phone] Accepts Medicaid   Medicaid-accepting [facility] Providers:  Organization         Address     Phone             Notes  [facility] [address], [place] [phone] Also accepts self-pay patients.  [facility] [address] Laurell Josephs [place], [place]  [phone]   [facility] [address], [place] [phone]   [facility] [address] [place] [phone]   Renaye Rakers [address], [place]   [phone] Only accepts [place] patients after they have their name applied to their card.   Self-Pay (no insurance) in [facility]:  Organization         Address     Phone             Notes  Sickle Cell Patients, [facility] Internal Medicine [address] [place], [place] [phone]   [facility] [address] [place], [place] [phone]   [facility] [place]  [address], [place] [phone]   [facility]/Dr. Osei-Bonsu  [address] [place] or [address], [place] [phone] Phone number for both [place] and [place] locations is the same.  [facility] [address], [place] [phone]   [facility] [address], [place] or [address] [phone]   [facility] [address], [place] [phone], phone; [phone], fax Sees patients 1st and 3rd Saturday of every month.  Must not qualify for public or private insurance (i.e. Medicaid, Medicare, [place] Health Choice, Veterans' Benefits)  Household income should be no more than 200% of the poverty level The clinic cannot treat you if you are pregnant or think you are pregnant  Sexually transmitted diseases are not treated at the  clinic.    Dental Care:  Organization         Address     Phone             Notes  [facility] Department of [facility] [address] [place], [place] [phone] Accepts children up to age 27 who are enrolled in [place] or [place] Health Choice; pregnant women with a Medicaid card; and children who have applied for Medicaid or [place] Health Choice, but were declined, whose parents can pay a reduced fee at time of service.  [facility] Department of [facility]  [address], [place] [phone] Accepts children up to age 60 who are enrolled in [place] or [place] Health Choice; pregnant women with a Medicaid card; and children who have applied for Medicaid or [place] Health Choice, but were declined, whose parents can pay a reduced fee at time of service.  [facility]  [address] [place], [place] [phone] Patients are seen by appointment only. Walk-ins are not accepted. [facility] will see patients 19 years of age and older. Monday - Tuesday (8am-5pm) Most Wednesdays (8:30-5pm) $30 per visit, cash only  [facility] Adult Dental Access PROGRAM  [address], [facility] [phone] Patients are seen by appointment only. Walk-ins are not accepted. [facility] will see patients 36 years of age and older. One Wednesday Evening (Monthly: Volunteer Based).  $30 per visit, cash only  [employer]  [phone] for adults; Children under age 96, call Graduate Pediatric Dentistry at [phone]. Children aged 74-14, please call [phone] to request a pediatric application.  Dental services are provided in all areas of dental care including fillings, crowns and bridges, complete and partial dentures, implants, gum treatment, root canals, and extractions. Preventive care is also provided. Treatment is provided to both adults and children. Patients are selected via a lottery and there  is often a waiting list.   [facility] [address] [place]  [phone] [email or web address]   [facility] [address] [place], [place] [phone], Ext. [phone] Second and Fourth Thursday of each month, opens at 6:30 AM; Clinic ends at 9 AM.  Patients are seen on a first-come first-served basis, and a limited number are seen during each clinic.   [facility]  [address] Ether Griffins [place], [place] [phone]   Eligibility Requirements You must have lived in [place], [place], or [place] counties for at least the last three months.   You cannot be eligible for state or federal sponsored [place], including [employer], [place], or [employer].   You generally cannot be eligible for healthcare insurance through your employer.    How to apply: Eligibility screenings are held every Tuesday and Wednesday afternoon from 1:00 pm until 4:00 pm. You do not need an appointment for the interview!  [facility] [address], [place], [place] [phone]   [facility] Health Department  [phone]   [facility] Health Department  [phone]   [facility] Health Department  [phone]    Behavioral Health Resources in the Community: Intensive Outpatient Programs Organization         Address     Phone             Notes  [facility] [address] [place], [place] [phone]   [facility] [address], [place], [place] [phone]   [facility] [address] [place], [place]  [phone]   [facility] [address]  [place], [place] [phone] or [phone]     Substance Abuse Resources Organization         Address     Phone             Notes  [facility]  [phone]   [facility]  [phone]   [facility]  [phone]   [facility]  [phone]    Residential & Outpatient Substance Abuse Program  [phone]   Psychological Services Organization         Address     Phone             Notes  [facility]  [phone]- [phone]   [facility]  [phone]- [phone]   [facility] [address], [place] [phone] or [phone]    Mobile Crisis Teams Organization         Address     Phone             Notes  [facility], Mobile Crisis Care Unit  [phone]   [facility]  [address] [place], [place] [phone]   Doristine LocksSharon DeEsch [address] [place] [phone]    Self-Help/Support Groups Organization         Address     Phone             Notes  [facility] [place] - variety of support groups  336- [phone] Call for more information  Narcotics Anonymous (NA), [facility] [address], [place]  2 meetings at this location   Statisticianesidential Treatment Programs Organization         Address     Phone             Notes  [facility] [address],    [place]  [phone]   [facility]  [address], [place] [id number], [place], [place] [phone]   [facility] [address], [place] [phone] Admissions: 8am-3pm M-F  [facility] [address]    [place], [place] [phone]   [facility] [address], [place], [place] [phone]   [facility] [address]  [place], [place] [phone]   [facility] - Intensive Outpatient [address], [place], [place] [phone]   [facility] ([facility] [address]  [place], [place] [phone] or [phone]   [facility] ([facility]) [address] [place], [place] [phone] Accepts Medicaid  [facility] [address]  [place] [phone] Substance Abuse/Addiction Treatment    [facility] Resources Organization         Address     Phone             Notes  [facility]  [phone]) [phone]   Angie FavaJulie Brannon, PhD [address] [place], [place]   [phone] or [phone]   [facility]   [address] [place], [place] [phone]) [phone]   [facility] [address], [place], [place] [phone]) [phone] Insurance/Medicaid/sponsorship through [facility]  [facility] Families [address]                                    [place], [place] [phone] Therapy/tele-psych/case  [facility] [address]   [place], [place] [phone]) [phone]    Dr. Lolly MustacheArfeen  [phone]   [facility] [place]  [employer] [facility] 1) [address], [place] 2) [address], [place] 3)  [address], [place] [phone]) [phone](336) [phone]  [phone]   [facility] Child Abuse Hotline [phone](336) 8645012203 or [phone]) [phone] (After Hours)

## 2014-12-19 NOTE — ED Notes (Addendum)
Pt requesting opiates, was offered tylenol, tramadol or toradol by Dr Jeraldine LootsLockwood.  Pt refused.  Pt yelling from room stating "that methadone blocks all of those meds."  Pt cursing wanting to take her home Restoril and clonapine.  Pt removed her gown and refused to let this RN help her put it back on.  Pt continues to yell from room.

## 2014-12-19 NOTE — ED Notes (Addendum)
Pt admittedly refused ativan requesting other medications that Dr Jeraldine LootsLockwood had previous told the pt she could not have.  Pt cursed at staff pacing in the room.  Pt refused additional vital signs.  Pt refused to take her discharge paperwork.  Pt repeated we "have not helped her and never have helped her," despite repeating to the pt she's refused care this visit and other visits.  Pt states she's "not leaving until I get my medications."  Advised pt she has refused multiple medication offers.  Pt tried to remove all of her belongings from her bag again after staff packed them up for her.  Escorted by security and [employer] from room.  Pt continued to curse while walking out, yelling at visitors and pts in the lobby, "That's why people come here to die."  [employer] and security carried belongings with pt.

## 2014-12-19 NOTE — ED Notes (Signed)
Pt arrived by [facility]. Reports generalized fatigue and exhaustion. Pt recently was kicked out of her house, has been walking x 4 days, not eating or drinking.

## 2014-12-19 NOTE — ED Notes (Signed)
Per EMS encode, pt was found sitting on the side of the road attempting to flag down assistance. [place] over the past 3 days.

## 2014-12-27 ENCOUNTER — Encounter (HOSPITAL_COMMUNITY): Payer: Self-pay | Admitting: Emergency Medicine

## 2014-12-27 ENCOUNTER — Emergency Department (INDEPENDENT_AMBULATORY_CARE_PROVIDER_SITE_OTHER)
Admission: EM | Admit: 2014-12-27 | Discharge: 2014-12-27 | Disposition: A | Discharge status: Home / Self Care | Payer: Medicaid Other | Source: Home / Self Care | Attending: Family Medicine | Admitting: Family Medicine

## 2014-12-27 DIAGNOSIS — M62838 Other muscle spasm: Secondary | ICD-10-CM | POA: Diagnosis not present

## 2014-12-27 MED ORDER — BUTALBITAL-APAP-CAFFEINE 50-500-40 MG PO TABS: 1.0000 | ORAL_TABLET | Freq: Four times a day (QID) | ORAL | Status: DC | PRN

## 2014-12-27 MED ORDER — CYCLOBENZAPRINE HCL 5 MG PO TABS
5.0000 mg | ORAL_TABLET | Freq: Three times a day (TID) | ORAL | Status: DC | PRN
Start: 2014-12-27 — End: 2014-12-31

## 2014-12-27 NOTE — ED Notes (Signed)
Pt states that she is having neck and back pain since this morning pt denies any injury or fall

## 2014-12-27 NOTE — ED Provider Notes (Signed)
Annette SamStacey Ann Montgomery is a 59 y.o. female who presents to Urgent Care today for back and neck pain. Patient has a long history of chronic neck pain. She was doing pretty well until recently she developed severe cervical neck pain. The pain does not radiate. She also has a headache that she attributes to tension in her neck. No weakness or numbness fevers or chills. She's tried Tylenol which has not helped. She's done well in the past with Flexeril.   Past Medical History  Diagnosis Date  . Anxiety   . Insomnia   . Neck injury   . Back pain   . Pulmonary embolism   . PTSD (post-traumatic stress disorder)   . Panic attacks   . Polysubstance abuse    History reviewed. No pertinent past surgical history. History  Substance Use Topics  . Smoking status: Heavy Tobacco Smoker -- 0.50 packs/day for 10 years    Types: Cigarettes  . Smokeless tobacco: Not on file  . Alcohol Use: No   ROS as above Medications: No current facility-administered medications for this encounter.   Current Outpatient Prescriptions  Medication Sig Dispense Refill  . butalbital-acetaminophen-caffeine (ESGIC PLUS) 50-500-40 MG per tablet Take 1 tablet by mouth every 6 (six) hours as needed for pain. 15 tablet 0  . clonazePAM (KLONOPIN) 2 MG tablet Take 2 mg by mouth daily as needed for anxiety.    . cyclobenzaprine (FLEXERIL) 5 MG tablet Take 1 tablet (5 mg total) by mouth 3 (three) times daily as needed for muscle spasms. 90 tablet 1  . temazepam (RESTORIL) 30 MG capsule Take 1 capsule (30 mg total) by mouth at bedtime as needed for sleep. 7 capsule 0   Allergies  Allergen Reactions  . Contrast Media [Iodinated Diagnostic Agents] Nausea And Vomiting  . Nsaids Hives     Exam:  BP 119/59 mmHg  Pulse 55  Temp(Src) 97.7 F (36.5 C) (Oral)  Resp 18  SpO2 100% Gen: Well NAD HEENT: EOMI,  MMM Lungs: Normal work of breathing. CTABL Heart: RRR no MRG Abd: NABS, Soft. Nondistended, Nontender Exts: Brisk capillary  refill, warm and well perfused.  Neck nontender to midline. Tender bilateral cervical paraspinals. Decreased flexion extension and rotational range of motion. Upper extremity strength reflexes and sensation are intact. Alert and oriented normal coordination gait and balance. Reflexes as above.  No results found for this or any previous visit (from the past 24 hour(s)). No results found.  Assessment and Plan: 59 y.o. female with cervical strain and headache. Treat with Flexeril and Fioricet. Follow-up with PCP.  Discussed warning signs or symptoms. Please see discharge instructions. Patient expresses understanding.     Rodolph BongEvan S Powell Halbert, MD 12/27/14 2032

## 2014-12-27 NOTE — Discharge Instructions (Signed)
Thank you for coming in today.  Go to the emergency room if your headache becomes excruciating or you have weakness or numbness or uncontrolled vomiting.  Come back or go to the emergency room if you notice new weakness new numbness problems walking or bowel or bladder problems.  Headaches, Frequently Asked Questions MIGRAINE HEADACHES Q: What is migraine? What causes it? How can I treat it? A: Generally, migraine headaches begin as a dull ache. Then they develop into a constant, throbbing, and pulsating pain. You may experience pain at the temples. You may experience pain at the front or back of one or both sides of the head. The pain is usually accompanied by a combination of:  Nausea.  Vomiting.  Sensitivity to light and noise. Some people (about 15%) experience an aura (see below) before an attack. The cause of migraine is believed to be chemical reactions in the brain. Treatment for migraine may include over-the-counter or prescription medications. It may also include self-help techniques. These include relaxation training and biofeedback.  Q: What is an aura? A: About 15% of people with migraine get an "aura". This is a sign of neurological symptoms that occur before a migraine headache. You may see wavy or jagged lines, dots, or flashing lights. You might experience tunnel vision or blind spots in one or both eyes. The aura can include visual or auditory hallucinations (something imagined). It may include disruptions in smell (such as strange odors), taste or touch. Other symptoms include:  Numbness.  A "pins and needles" sensation.  Difficulty in recalling or speaking the correct word. These neurological events may last as long as 60 minutes. These symptoms will fade as the headache begins. Q: What is a trigger? A: Certain physical or environmental factors can lead to or "trigger" a migraine. These include:  Foods.  Hormonal changes.  Weather.  Stress. It is important to  remember that triggers are different for everyone. To help prevent migraine attacks, you need to figure out which triggers affect you. Keep a headache diary. This is a good way to track triggers. The diary will help you talk to your healthcare professional about your condition. Q: Does weather affect migraines? A: Bright sunshine, hot, humid conditions, and drastic changes in barometric pressure may lead to, or "trigger," a migraine attack in some people. But studies have shown that weather does not act as a trigger for everyone with migraines. Q: What is the link between migraine and hormones? A: Hormones start and regulate many of your body's functions. Hormones keep your body in balance within a constantly changing environment. The levels of hormones in your body are unbalanced at times. Examples are during menstruation, pregnancy, or menopause. That can lead to a migraine attack. In fact, about three quarters of all women with migraine report that their attacks are related to the menstrual cycle.  Q: Is there an increased risk of stroke for migraine sufferers? A: The likelihood of a migraine attack causing a stroke is very remote. That is not to say that migraine sufferers cannot have a stroke associated with their migraines. In persons under age 20, the most common associated factor for stroke is migraine headache. But over the course of a person's normal life span, the occurrence of migraine headache may actually be associated with a reduced risk of dying from cerebrovascular disease due to stroke.  Q: What are acute medications for migraine? A: Acute medications are used to treat the pain of the headache after it has started.  Examples over-the-counter medications, NSAIDs, ergots, and triptans.  Q: What are the triptans? A: Triptans are the newest class of abortive medications. They are specifically targeted to treat migraine. Triptans are vasoconstrictors. They moderate some chemical reactions in  the brain. The triptans work on receptors in your brain. Triptans help to restore the balance of a neurotransmitter called serotonin. Fluctuations in levels of serotonin are thought to be a main cause of migraine.  Q: Are over-the-counter medications for migraine effective? A: Over-the-counter, or "OTC," medications may be effective in relieving mild to moderate pain and associated symptoms of migraine. But you should see your caregiver before beginning any treatment regimen for migraine.  Q: What are preventive medications for migraine? A: Preventive medications for migraine are sometimes referred to as "prophylactic" treatments. They are used to reduce the frequency, severity, and length of migraine attacks. Examples of preventive medications include antiepileptic medications, antidepressants, beta-blockers, calcium channel blockers, and NSAIDs (nonsteroidal anti-inflammatory drugs). Q: Why are anticonvulsants used to treat migraine? A: During the past few years, there has been an increased interest in antiepileptic drugs for the prevention of migraine. They are sometimes referred to as "anticonvulsants". Both epilepsy and migraine may be caused by similar reactions in the brain.  Q: Why are antidepressants used to treat migraine? A: Antidepressants are typically used to treat people with depression. They may reduce migraine frequency by regulating chemical levels, such as serotonin, in the brain.  Q: What alternative therapies are used to treat migraine? A: The term "alternative therapies" is often used to describe treatments considered outside the scope of conventional Western medicine. Examples of alternative therapy include acupuncture, acupressure, and yoga. Another common alternative treatment is herbal therapy. Some herbs are believed to relieve headache pain. Always discuss alternative therapies with your caregiver before proceeding. Some herbal products contain arsenic and other toxins. TENSION  HEADACHES Q: What is a tension-type headache? What causes it? How can I treat it? A: Tension-type headaches occur randomly. They are often the result of temporary stress, anxiety, fatigue, or anger. Symptoms include soreness in your temples, a tightening band-like sensation around your head (a "vice-like" ache). Symptoms can also include a pulling feeling, pressure sensations, and contracting head and neck muscles. The headache begins in your forehead, temples, or the back of your head and neck. Treatment for tension-type headache may include over-the-counter or prescription medications. Treatment may also include self-help techniques such as relaxation training and biofeedback. CLUSTER HEADACHES Q: What is a cluster headache? What causes it? How can I treat it? A: Cluster headache gets its name because the attacks come in groups. The pain arrives with little, if any, warning. It is usually on one side of the head. A tearing or bloodshot eye and a runny nose on the same side of the headache may also accompany the pain. Cluster headaches are believed to be caused by chemical reactions in the brain. They have been described as the most severe and intense of any headache type. Treatment for cluster headache includes prescription medication and oxygen. SINUS HEADACHES Q: What is a sinus headache? What causes it? How can I treat it? A: When a cavity in the bones of the face and skull (a sinus) becomes inflamed, the inflammation will cause localized pain. This condition is usually the result of an allergic reaction, a tumor, or an infection. If your headache is caused by a sinus blockage, such as an infection, you will probably have a fever. An x-ray will confirm a sinus blockage. Your  caregiver's treatment might include antibiotics for the infection, as well as antihistamines or decongestants.  REBOUND HEADACHES Q: What is a rebound headache? What causes it? How can I treat it? A: A pattern of taking acute  headache medications too often can lead to a condition known as "rebound headache." A pattern of taking too much headache medication includes taking it more than 2 days per week or in excessive amounts. That means more than the label or a caregiver advises. With rebound headaches, your medications not only stop relieving pain, they actually begin to cause headaches. Doctors treat rebound headache by tapering the medication that is being overused. Sometimes your caregiver will gradually substitute a different type of treatment or medication. Stopping may be a challenge. Regularly overusing a medication increases the potential for serious side effects. Consult a caregiver if you regularly use headache medications more than 2 days per week or more than the label advises. ADDITIONAL QUESTIONS AND ANSWERS Q: What is biofeedback? A: Biofeedback is a self-help treatment. Biofeedback uses special equipment to monitor your body's involuntary physical responses. Biofeedback monitors:  Breathing.  Pulse.  Heart rate.  Temperature.  Muscle tension.  Brain activity. Biofeedback helps you refine and perfect your relaxation exercises. You learn to control the physical responses that are related to stress. Once the technique has been mastered, you do not need the equipment any more. Q: Are headaches hereditary? A: Four out of five (80%) of people that suffer report a family history of migraine. Scientists are not sure if this is genetic or a family predisposition. Despite the uncertainty, a child has a 50% chance of having migraine if one parent suffers. The child has a 75% chance if both parents suffer.  Q: Can children get headaches? A: By the time they reach high school, most young people have experienced some type of headache. Many safe and effective approaches or medications can prevent a headache from occurring or stop it after it has begun.  Q: What type of doctor should I see to diagnose and treat my  headache? A: Start with your primary caregiver. Discuss his or her experience and approach to headaches. Discuss methods of classification, diagnosis, and treatment. Your caregiver may decide to recommend you to a headache specialist, depending upon your symptoms or other physical conditions. Having diabetes, allergies, etc., may require a more comprehensive and inclusive approach to your headache. The National Head[employer] will provide, upon request, a list of [facility] physician members in your state. Document Released: 11/03/2003 Document Revised: 11/05/2011 Document Reviewed: 04/12/2008 [facility] Patient Information 2015 [employer], [place]. This information is not intended to replace advice given to you by your health care provider. Make sure you discuss any questions you have with your health care provider.   Cervical Strain and Sprain (Whiplash) with Rehab Cervical strain and sprain are injuries that commonly occur with "whiplash" injuries. Whiplash occurs when the neck is forcefully whipped backward or forward, such as during a motor vehicle accident or during contact sports. The muscles, ligaments, tendons, discs, and nerves of the neck are susceptible to injury when this occurs. RISK FACTORS Risk of having a whiplash injury increases if:  Osteoarthritis of the spine.  Situations that make head or neck accidents or trauma more likely.  High-risk sports (football, rugby, wrestling, hockey, auto racing, gymnastics, diving, contact karate, or boxing).  Poor strength and flexibility of the neck.  Previous neck injury.  Poor tackling technique.  Improperly fitted or padded equipment. SYMPTOMS   Pain or  stiffness in the front or back of neck or both.  Symptoms may present immediately or up to 24 hours after injury.  Dizziness, headache, nausea, and vomiting.  Muscle spasm with soreness and stiffness in the neck.  Tenderness and swelling at the injury site. PREVENTION  Learn and  use proper technique (avoid tackling with the head, spearing, and head-butting; use proper falling techniques to avoid landing on the head).  Warm up and stretch properly before activity.  Maintain physical fitness:  Strength, flexibility, and endurance.  Cardiovascular fitness.  Wear properly fitted and padded protective equipment, such as padded soft collars, for participation in contact sports. PROGNOSIS  Recovery from cervical strain and sprain injuries is dependent on the extent of the injury. These injuries are usually curable in 1 week to 3 months with appropriate treatment.  RELATED COMPLICATIONS   Temporary numbness and weakness may occur if the nerve roots are damaged, and this may persist until the nerve has completely healed.  Chronic pain due to frequent recurrence of symptoms.  Prolonged healing, especially if activity is resumed too soon (before complete recovery). TREATMENT  Treatment initially involves the use of ice and medication to help reduce pain and inflammation. It is also important to perform strengthening and stretching exercises and modify activities that worsen symptoms so the injury does not get worse. These exercises may be performed at home or with a therapist. For patients who experience severe symptoms, a soft, padded collar may be recommended to be worn around the neck.  Improving your posture may help reduce symptoms. Posture improvement includes pulling your chin and abdomen in while sitting or standing. If you are sitting, sit in a firm chair with your buttocks against the back of the chair. While sleeping, try replacing your pillow with a small towel rolled to 2 inches in diameter, or use a cervical pillow or soft cervical collar. Poor sleeping positions delay healing.  For patients with nerve root damage, which causes numbness or weakness, the use of a cervical traction apparatus may be recommended. Surgery is rarely necessary for these injuries. However,  cervical strain and sprains that are present at birth (congenital) may require surgery. MEDICATION   If pain medication is necessary, nonsteroidal anti-inflammatory medications, such as aspirin and ibuprofen, or other minor pain relievers, such as acetaminophen, are often recommended.  Do not take pain medication for 7 days before surgery.  Prescription pain relievers may be given if deemed necessary by your caregiver. Use only as directed and only as much as you need. HEAT AND COLD:   Cold treatment (icing) relieves pain and reduces inflammation. Cold treatment should be applied for 10 to 15 minutes every 2 to 3 hours for inflammation and pain and immediately after any activity that aggravates your symptoms. Use ice packs or an ice massage.  Heat treatment may be used prior to performing the stretching and strengthening activities prescribed by your caregiver, physical therapist, or athletic trainer. Use a heat pack or a warm soak. SEEK MEDICAL CARE IF:   Symptoms get worse or do not improve in 2 weeks despite treatment.  New, unexplained symptoms develop (drugs used in treatment may produce side effects). EXERCISES RANGE OF MOTION (ROM) AND STRETCHING EXERCISES - Cervical Strain and Sprain These exercises may help you when beginning to rehabilitate your injury. In order to successfully resolve your symptoms, you must improve your posture. These exercises are designed to help reduce the forward-head and rounded-shoulder posture which contributes to this condition. Your symptoms  may resolve with or without further involvement from your physician, physical therapist or athletic trainer. While completing these exercises, remember:   Restoring tissue flexibility helps normal motion to return to the joints. This allows healthier, less painful movement and activity.  An effective stretch should be held for at least 20 seconds, although you may need to begin with shorter hold times for  comfort.  A stretch should never be painful. You should only feel a gentle lengthening or release in the stretched tissue. STRETCH- Axial Extensors  Lie on your back on the floor. You may bend your knees for comfort. Place a rolled-up hand towel or dish towel, about 2 inches in diameter, under the part of your head that makes contact with the floor.  Gently tuck your chin, as if trying to make a "double chin," until you feel a gentle stretch at the base of your head.  Hold __________ seconds. Repeat __________ times. Complete this exercise __________ times per day.  STRETCH - Axial Extension   Stand or sit on a firm surface. Assume a good posture: chest up, shoulders drawn back, abdominal muscles slightly tense, knees unlocked (if standing) and feet hip width apart.  Slowly retract your chin so your head slides back and your chin slightly lowers. Continue to look straight ahead.  You should feel a gentle stretch in the back of your head. Be certain not to feel an aggressive stretch since this can cause headaches later.  Hold for __________ seconds. Repeat __________ times. Complete this exercise __________ times per day. STRETCH - Cervical Side Bend   Stand or sit on a firm surface. Assume a good posture: chest up, shoulders drawn back, abdominal muscles slightly tense, knees unlocked (if standing) and feet hip width apart.  Without letting your nose or shoulders move, slowly tip your right / left ear to your shoulder until your feel a gentle stretch in the muscles on the opposite side of your neck.  Hold __________ seconds. Repeat __________ times. Complete this exercise __________ times per day. STRETCH - Cervical Rotators   Stand or sit on a firm surface. Assume a good posture: chest up, shoulders drawn back, abdominal muscles slightly tense, knees unlocked (if standing) and feet hip width apart.  Keeping your eyes level with the ground, slowly turn your head until you feel a  gentle stretch along the back and opposite side of your neck.  Hold __________ seconds. Repeat __________ times. Complete this exercise __________ times per day. RANGE OF MOTION - Neck Circles   Stand or sit on a firm surface. Assume a good posture: chest up, shoulders drawn back, abdominal muscles slightly tense, knees unlocked (if standing) and feet hip width apart.  Gently roll your head down and around from the back of one shoulder to the back of the other. The motion should never be forced or painful.  Repeat the motion 10-20 times, or until you feel the neck muscles relax and loosen. Repeat __________ times. Complete the exercise __________ times per day. STRENGTHENING EXERCISES - Cervical Strain and Sprain These exercises may help you when beginning to rehabilitate your injury. They may resolve your symptoms with or without further involvement from your physician, physical therapist, or athletic trainer. While completing these exercises, remember:   Muscles can gain both the endurance and the strength needed for everyday activities through controlled exercises.  Complete these exercises as instructed by your physician, physical therapist, or athletic trainer. Progress the resistance and repetitions only as guided.  You may experience muscle soreness or fatigue, but the pain or discomfort you are trying to eliminate should never worsen during these exercises. If this pain does worsen, stop and make certain you are following the directions exactly. If the pain is still present after adjustments, discontinue the exercise until you can discuss the trouble with your clinician. STRENGTH - Cervical Flexors, Isometric  Face a wall, standing about 6 inches away. Place a small pillow, a ball about 6-8 inches in diameter, or a folded towel between your forehead and the wall.  Slightly tuck your chin and gently push your forehead into the soft object. Push only with mild to moderate intensity,  building up tension gradually. Keep your jaw and forehead relaxed.  Hold 10 to 20 seconds. Keep your breathing relaxed.  Release the tension slowly. Relax your neck muscles completely before you start the next repetition. Repeat __________ times. Complete this exercise __________ times per day. STRENGTH- Cervical Lateral Flexors, Isometric   Stand about 6 inches away from a wall. Place a small pillow, a ball about 6-8 inches in diameter, or a folded towel between the side of your head and the wall.  Slightly tuck your chin and gently tilt your head into the soft object. Push only with mild to moderate intensity, building up tension gradually. Keep your jaw and forehead relaxed.  Hold 10 to 20 seconds. Keep your breathing relaxed.  Release the tension slowly. Relax your neck muscles completely before you start the next repetition. Repeat __________ times. Complete this exercise __________ times per day. STRENGTH - Cervical Extensors, Isometric   Stand about 6 inches away from a wall. Place a small pillow, a ball about 6-8 inches in diameter, or a folded towel between the back of your head and the wall.  Slightly tuck your chin and gently tilt your head back into the soft object. Push only with mild to moderate intensity, building up tension gradually. Keep your jaw and forehead relaxed.  Hold 10 to 20 seconds. Keep your breathing relaxed.  Release the tension slowly. Relax your neck muscles completely before you start the next repetition. Repeat __________ times. Complete this exercise __________ times per day. POSTURE AND BODY MECHANICS CONSIDERATIONS - Cervical Strain and Sprain Keeping correct posture when sitting, standing or completing your activities will reduce the stress put on different body tissues, allowing injured tissues a chance to heal and limiting painful experiences. The following are general guidelines for improved posture. Your physician or physical therapist will provide  you with any instructions specific to your needs. While reading these guidelines, remember:  The exercises prescribed by your provider will help you have the flexibility and strength to maintain correct postures.  The correct posture provides the optimal environment for your joints to work. All of your joints have less wear and tear when properly supported by a spine with good posture. This means you will experience a healthier, less painful body.  Correct posture must be practiced with all of your activities, especially prolonged sitting and standing. Correct posture is as important when doing repetitive low-stress activities (typing) as it is when doing a single heavy-load activity (lifting). PROLONGED STANDING WHILE SLIGHTLY LEANING FORWARD When completing a task that requires you to lean forward while standing in one place for a long time, place either foot up on a stationary 2- to 4-inch high object to help maintain the best posture. When both feet are on the ground, the low back tends to lose its slight inward curve.  If this curve flattens (or becomes too large), then the back and your other joints will experience too much stress, fatigue more quickly, and can cause pain.  RESTING POSITIONS Consider which positions are most painful for you when choosing a resting position. If you have pain with flexion-based activities (sitting, bending, stooping, squatting), choose a position that allows you to rest in a less flexed posture. You would want to avoid curling into a fetal position on your side. If your pain worsens with extension-based activities (prolonged standing, working overhead), avoid resting in an extended position such as sleeping on your stomach. Most people will find more comfort when they rest with their spine in a more neutral position, neither too rounded nor too arched. Lying on a non-sagging bed on your side with a pillow between your knees, or on your back with a pillow under your  knees will often provide some relief. Keep in mind, being in any one position for a prolonged period of time, no matter how correct your posture, can still lead to stiffness. WALKING Walk with an upright posture. Your ears, shoulders, and hips should all line up. OFFICE WORK When working at a desk, create an environment that supports good, upright posture. Without extra support, muscles fatigue and lead to excessive strain on joints and other tissues. CHAIR:  A chair should be able to slide under your desk when your back makes contact with the back of the chair. This allows you to work closely.  The chair's height should allow your eyes to be level with the upper part of your monitor and your hands to be slightly lower than your elbows.  Body position:  Your feet should make contact with the floor. If this is not possible, use a foot rest.  Keep your ears over your shoulders. This will reduce stress on your neck and low back. Document Released: 08/13/2005 Document Revised: 12/28/2013 Document Reviewed: 11/25/2008 [facility] Patient Information 2015 [place], [place]. This information is not intended to replace advice given to you by your health care provider. Make sure you discuss any questions you have with your health care provider.

## 2014-12-28 ENCOUNTER — Emergency Department (HOSPITAL_COMMUNITY)
Admission: EM | Admit: 2014-12-28 | Discharge: 2014-12-28 | Discharge status: Against Medical Advice | Payer: Medicaid Other | Attending: Emergency Medicine | Admitting: Emergency Medicine

## 2014-12-28 ENCOUNTER — Encounter (HOSPITAL_COMMUNITY): Payer: Self-pay | Admitting: Emergency Medicine

## 2014-12-28 DIAGNOSIS — Y998 Other external cause status: Secondary | ICD-10-CM | POA: Insufficient documentation

## 2014-12-28 DIAGNOSIS — M542 Cervicalgia: Secondary | ICD-10-CM | POA: Diagnosis not present

## 2014-12-28 DIAGNOSIS — Y9289 Other specified places as the place of occurrence of the external cause: Secondary | ICD-10-CM | POA: Insufficient documentation

## 2014-12-28 DIAGNOSIS — S8992XA Unspecified injury of left lower leg, initial encounter: Secondary | ICD-10-CM | POA: Diagnosis not present

## 2014-12-28 DIAGNOSIS — W010XXA Fall on same level from slipping, tripping and stumbling without subsequent striking against object, initial encounter: Secondary | ICD-10-CM | POA: Diagnosis not present

## 2014-12-28 DIAGNOSIS — Y9389 Activity, other specified: Secondary | ICD-10-CM | POA: Insufficient documentation

## 2014-12-28 DIAGNOSIS — Z720 Tobacco use: Secondary | ICD-10-CM | POA: Insufficient documentation

## 2014-12-28 HISTORY — DX: Homelessness: Z59.0

## 2014-12-28 HISTORY — DX: Homelessness unspecified: Z59.00

## 2014-12-28 NOTE — ED Notes (Signed)
Tech went to get pt to take her to a room and pt told tech that she wasn't going and that she was going home.

## 2014-12-28 NOTE — ED Notes (Signed)
Arrived with EMS from street , pt. slipped and fell on wet pavement this evening , no LOC , ambulatory , reports generalized body aches , left knee pain , and neck pain , c- collar applied by EMS but pt. removed her c- collar at triage despite nurse's explainations for her safety.

## 2014-12-30 ENCOUNTER — Emergency Department (HOSPITAL_COMMUNITY)
Admission: EM | Admit: 2014-12-30 | Discharge: 2014-12-31 | Disposition: A | Discharge status: Home / Self Care | Payer: Medicaid Other | Attending: Emergency Medicine | Admitting: Emergency Medicine

## 2014-12-30 ENCOUNTER — Encounter (HOSPITAL_COMMUNITY): Payer: Self-pay | Admitting: *Deleted

## 2014-12-30 DIAGNOSIS — F410 Panic disorder [episodic paroxysmal anxiety] without agoraphobia: Secondary | ICD-10-CM | POA: Insufficient documentation

## 2014-12-30 DIAGNOSIS — Z87828 Personal history of other (healed) physical injury and trauma: Secondary | ICD-10-CM | POA: Insufficient documentation

## 2014-12-30 DIAGNOSIS — F1310 Sedative, hypnotic or anxiolytic abuse, uncomplicated: Secondary | ICD-10-CM | POA: Insufficient documentation

## 2014-12-30 DIAGNOSIS — R45851 Suicidal ideations: Secondary | ICD-10-CM

## 2014-12-30 DIAGNOSIS — Z590 Homelessness: Secondary | ICD-10-CM | POA: Diagnosis not present

## 2014-12-30 DIAGNOSIS — Z86711 Personal history of pulmonary embolism: Secondary | ICD-10-CM | POA: Diagnosis not present

## 2014-12-30 DIAGNOSIS — G4700 Insomnia, unspecified: Secondary | ICD-10-CM | POA: Diagnosis not present

## 2014-12-30 DIAGNOSIS — Z720 Tobacco use: Secondary | ICD-10-CM | POA: Insufficient documentation

## 2014-12-30 DIAGNOSIS — F1910 Other psychoactive substance abuse, uncomplicated: Secondary | ICD-10-CM | POA: Diagnosis present

## 2014-12-30 LAB — CBC
HCT: 41.5 % (ref 36.0–46.0)
Hemoglobin: 14.1 g/dL (ref 12.0–15.0)
MCH: 30.1 pg (ref 26.0–34.0)
MCHC: 34 g/dL (ref 30.0–36.0)
MCV: 88.5 fL (ref 78.0–100.0)
PLATELETS: 206 10*3/uL (ref 150–400)
RBC: 4.69 MIL/uL (ref 3.87–5.11)
RDW: 14.3 % (ref 11.5–15.5)
WBC: 4.1 10*3/uL (ref 4.0–10.5)

## 2014-12-30 LAB — BASIC METABOLIC PANEL
ANION GAP: 10 (ref 5–15)
BUN: 9 mg/dL (ref 6–20)
CHLORIDE: 108 mmol/L (ref 101–111)
CO2: 25 mmol/L (ref 22–32)
Calcium: 9.3 mg/dL (ref 8.9–10.3)
Creatinine, Ser: 0.85 mg/dL (ref 0.44–1.00)
GFR calc Af Amer: >60 mL/min (ref >60)
GFR calc non Af Amer: >60 mL/min (ref >60)
Glucose, Bld: 102 mg/dL — ABNORMAL HIGH (ref 70–99)
POTASSIUM: 3.5 mmol/L (ref 3.5–5.1)
Sodium: 143 mmol/L (ref 135–145)

## 2014-12-30 LAB — RAPID URINE DRUG SCREEN, HOSP PERFORMED
Amphetamines: NOT DETECTED
BENZODIAZEPINES: POSITIVE — AB
Barbiturates: POSITIVE — AB
Cocaine: NOT DETECTED
Opiates: NOT DETECTED
Tetrahydrocannabinol: NOT DETECTED

## 2014-12-30 LAB — ETHANOL: Alcohol, Ethyl (B): <5 mg/dL (ref <5)

## 2014-12-30 MED ORDER — LORAZEPAM 1 MG PO TABS: 1.0000 mg | ORAL_TABLET | Freq: Four times a day (QID) | ORAL | Status: DC | PRN

## 2014-12-30 MED ORDER — ONDANSETRON 4 MG PO TBDP: 4.0000 mg | ORAL_TABLET | Freq: Four times a day (QID) | ORAL | Status: DC | PRN

## 2014-12-30 MED ORDER — LOPERAMIDE HCL 2 MG PO CAPS: 2.0000 mg | ORAL_CAPSULE | ORAL | Status: DC | PRN

## 2014-12-30 MED ORDER — LORAZEPAM 1 MG PO TABS
1.0000 mg | ORAL_TABLET | Freq: Three times a day (TID) | ORAL | Status: DC | PRN
  Administered 2014-12-30: 1 mg via ORAL
  Filled 2014-12-30: qty 1

## 2014-12-30 MED ORDER — ALUM & MAG HYDROXIDE-SIMETH 200-200-20 MG/5ML PO SUSP: 30.0000 mL | ORAL | Status: DC | PRN

## 2014-12-30 MED ORDER — GABAPENTIN 300 MG PO CAPS
300.0000 mg | ORAL_CAPSULE | Freq: Three times a day (TID) | ORAL | Status: DC
  Filled 2014-12-30 (×2): qty 1

## 2014-12-30 MED ORDER — LORAZEPAM 1 MG PO TABS
1.0000 mg | ORAL_TABLET | Freq: Four times a day (QID) | ORAL | Status: DC
  Administered 2014-12-30 – 2014-12-31 (×2): 1 mg via ORAL
  Filled 2014-12-30 (×3): qty 1

## 2014-12-30 MED ORDER — LORAZEPAM 1 MG PO TABS: 1.0000 mg | ORAL_TABLET | Freq: Two times a day (BID) | ORAL | Status: DC

## 2014-12-30 MED ORDER — ACETAMINOPHEN 325 MG PO TABS
650.0000 mg | ORAL_TABLET | ORAL | Status: DC | PRN
  Filled 2014-12-30: qty 2

## 2014-12-30 MED ORDER — VITAMIN B-1 100 MG PO TABS
100.0000 mg | ORAL_TABLET | Freq: Every day | ORAL | Status: DC
  Filled 2014-12-30: qty 1

## 2014-12-30 MED ORDER — LORAZEPAM 1 MG PO TABS: 1.0000 mg | ORAL_TABLET | Freq: Three times a day (TID) | ORAL | Status: DC

## 2014-12-30 MED ORDER — NICOTINE 21 MG/24HR TD PT24
21.0000 mg | MEDICATED_PATCH | Freq: Every day | TRANSDERMAL | Status: DC
Start: 2014-12-30 — End: 2014-12-31
  Administered 2014-12-30 – 2014-12-31 (×2): 21 mg via TRANSDERMAL
  Filled 2014-12-30 (×2): qty 1

## 2014-12-30 MED ORDER — METHADONE HCL 10 MG/ML PO CONC: 70.0000 mg | Freq: Every day | ORAL | Status: DC

## 2014-12-30 MED ORDER — METHADONE HCL 10 MG PO TABS
70.0000 mg | ORAL_TABLET | Freq: Every day | ORAL | Status: DC
  Administered 2014-12-30 – 2014-12-31 (×2): 70 mg via ORAL
  Filled 2014-12-30 (×2): qty 7

## 2014-12-30 MED ORDER — HYDROXYZINE HCL 25 MG PO TABS: 25.0000 mg | ORAL_TABLET | Freq: Four times a day (QID) | ORAL | Status: DC | PRN

## 2014-12-30 MED ORDER — THIAMINE HCL 100 MG/ML IJ SOLN
100.0000 mg | Freq: Once | INTRAMUSCULAR | Status: AC
  Administered 2014-12-30: 100 mg via INTRAMUSCULAR
  Filled 2014-12-30: qty 2

## 2014-12-30 MED ORDER — LORAZEPAM 1 MG PO TABS: 1.0000 mg | ORAL_TABLET | Freq: Every day | ORAL | Status: DC

## 2014-12-30 MED ORDER — ONDANSETRON HCL 4 MG PO TABS: 4.0000 mg | ORAL_TABLET | Freq: Three times a day (TID) | ORAL | Status: DC | PRN

## 2014-12-30 MED ORDER — ADULT MULTIVITAMIN W/MINERALS CH
1.0000 | ORAL_TABLET | Freq: Every day | ORAL | Status: DC
  Filled 2014-12-30: qty 1

## 2014-12-30 NOTE — ED Notes (Signed)
Pt will be spending the night in the ED and EDP will reassess in the morning.

## 2014-12-30 NOTE — ED Notes (Signed)
IN TO HAVE PATIENT SIGN HER VOLUNTARY ADMISSION PAPERS TO GO TO BH. PATIENT STATES SHE DOESN'T WANT TO GO TO BH SHE WANTS TO GO TO A DRUG AND ALCOHOL TREATMENT FACILITY. SHE REFUSES TO SIGN IN VOLUNTARILY. ADVISED PT THAT DUE TO HER VERBALIZING SI WITH MOBILE CRISIS THAT SHE WOULD NEED TO GO TO BH FOR MENTAL HEALTH OBSERVATION AND THEY COULD HELP HER GET TO A TREATMENT FACILITY. PATIENT ANIMATELY REFUSES TO SIGN IN VOLUNTARILY. ADVISED HER WE WOULD HAVE TO IVC HER IF SHE REFUSES CARE . PATIENT STATES TO GO AHEAD BECAUSE SHE DOESN'T WANT TO GO.

## 2014-12-30 NOTE — [facility] Counselor (Addendum)
Per [employer], patient will be re-assessedd by the extender in the morning.

## 2014-12-30 NOTE — BH Assessment (Signed)
Tele Assessment Note   Annette Montgomery is an 59 y.o. 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 Pt states that she has been using drugs to cope since she lost her brother and close family and friends over 10 years ago. She states that she currently goes to a methadone clinic to receive her daily dose and is prescribed benzodiazapines which she admits to abusing. She denies SI at this time or any history of SI which is inconsistent with documentation. She states that she lied the [facility] and said she was suicidal so she would have a place to stay. She says that she is now homeless and stays in bushes and on the street. Pt denies any HI or A/V hallucinations. She states that she takes around 15mg  of xanex a day with the last use being 3 days ago. She says that she is having some withdrawal symptoms- shaking, nausea, and weakness. She says she has not eaten in a week and sleeps around 1 hour a night. She states that she was at the methadone clinic trying to get help for her benzo addiction but they misunderstood her and sent mobile crisis to do an evaluation. Mobile crisis sent her to the emergency room to assess for SI. She states that "if she wanted to die she would have just taken all of her benzos but she "obviously doesn't want to die". Pt story is inconsistent with last assessment.   Annette Rankin NP was consulted and recommends pt go to observation unit to monitor for withdrawal symptoms and get her connected with long term treatment for addiction. [facility] when blood pressure comes down.    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 Anxiety   . Insomnia   . Neck injury   . Back pain   . Pulmonary embolism   . PTSD (post-traumatic stress disorder)   . Panic attacks   . Polysubstance  abuse   . Homelessness    Axis IV: economic problems, housing problems and other psychosocial or environmental problems Axis V: 41-50 serious symptoms  Past Medical History:  Past Medical History  Diagnosis Date  . Anxiety   . Insomnia   . Neck injury   . Back pain   . Pulmonary embolism   . PTSD (post-traumatic stress disorder)   . Panic attacks   . Polysubstance abuse   . Homelessness     History reviewed. No pertinent past surgical history.  Family History: No family history on file.  Social History:  reports that she has been smoking Cigarettes.  She has a 5 pack-year smoking history. She does not have any smokeless tobacco history on file. She reports that she uses illicit drugs. She reports that she does not drink alcohol.  Additional Social History:  Alcohol / Drug Use Pain Medications: Goes to a methadone clinic History of alcohol / drug use?: Yes Substance #1 Name of Substance 1: Opiate (methadone)  1 - Age of First Use: 12 1 - Amount (size/oz): Unspecified  1 - Frequency: daily  1 - Duration: 10 years  1 - Last Use / Amount: Today  Substance #2 Name of Substance 2: benzodiazapines  2 - Age of First Use: Unspecified 2 - Amount (size/oz): 15mg  2 - Frequency: daily  2 - Duration: "years"  2 - Last Use / Amount: 3 days ago  CIWA: CIWA-Ar BP: 156/92 mmHg Pulse Rate: 61 COWS:    PATIENT STRENGTHS: (choose at least two) Average or above average intelligence Motivation for treatment/growth  Allergies:  Allergies  Allergen Reactions  . Contrast Media [Iodinated Diagnostic Agents] Nausea And Vomiting  . Erythromycin Nausea And Vomiting  . Nsaids Hives    Home Medications:  (Not in a hospital admission)  OB/GYN Status:  No LMP recorded. Patient is postmenopausal.  General Assessment Data Location of Assessment: [facility] ED TTS Assessment: In system Is this a Tele or Face-to-Face Assessment?: Tele Assessment Is this an Initial Assessment or a Re-assessment  for this encounter?: Initial Assessment Marital status: Single Is patient pregnant?: No Pregnancy Status: No Living Arrangements: Other (Comment) (homeless) Can pt return to current living arrangement?: No Admission Status: Other (Comment) (Pt states, " I am not suicidal. I just need help coming off of benzos." denies HI, reports homelessness, pt hx of similar complaints, pt states, "I took a barbituate before I came here to help my blood pressure go down.") Is patient capable of signing voluntary admission?: Yes Referral Source: Self/Family/Friend Insurance type:  (Medicaid)     Crisis Care Plan Living Arrangements: Other (Comment) (homeless) Name of Psychiatrist:  Tax inspector off of [address] ) Name of Therapist: Darl Montgomery  Education Status Is patient currently in school?: No Highest grade of school patient has completed: 12  Risk to self with the past 6 months Suicidal Ideation: No Has patient been a risk to self within the past 6 months prior to admission? : Yes Suicidal Intent: No Has patient had any suicidal intent within the past 6 months prior to admission? : Yes Is patient at risk for suicide?: No Suicidal Plan?: No Has patient had any suicidal plan within the past 6 months prior to admission? : No Access to Means: No What has been your use of drugs/alcohol within the last 12 months?: takes methadone and benzodiazipines Previous Attempts/Gestures: No How many times?: 0 Other Self Harm Risks: Drug use Triggers for Past Attempts: None known Intentional Self Injurious Behavior: None Family Suicide History: Unknown Recent stressful life event(s): Loss (Comment) (lost a lot of her relatives and friends) Persecutory voices/beliefs?: No Depression: Yes Depression Symptoms: Feeling worthless/self pity Substance abuse history and/or treatment for substance abuse?: Yes Suicide prevention information given to non-admitted patients: Not applicable  Risk to Others within the  past 6 months Homicidal Ideation: No Does patient have any lifetime risk of violence toward others beyond the six months prior to admission? : No Thoughts of Harm to Others: No Current Homicidal Intent: No Current Homicidal Plan: No Access to Homicidal Means: No History of harm to others?: No Assessment of Violence: None Noted Does patient have access to weapons?: No Criminal Charges Pending?: No Does patient have a court date: No Is patient on probation?: No  Psychosis Hallucinations: None noted Delusions: None noted  Mental Status Report Appearance/Hygiene: In hospital gown Eye Contact: Fair Motor Activity: Agitation Speech: Logical/coherent Level of Consciousness: Alert Mood: Anxious Affect: Appropriate to circumstance Anxiety Level: Moderate Thought Processes: Coherent Judgement: Partial Orientation: Person, Place, Time, Situation Obsessive Compulsive Thoughts/Behaviors: None  Cognitive Functioning Concentration: Normal Memory: Recent Intact, Remote Intact IQ: Average Insight: Poor Impulse Control: Fair Appetite: Poor Weight Loss: 0 Weight Gain: 0 Sleep: Decreased Total Hours of Sleep: 1 Vegetative Symptoms: None  ADLScreening [facility] Assessment Services) Patient's cognitive ability adequate to safely complete daily activities?: Yes Patient able to express  need for assistance with ADLs?: Yes Independently performs ADLs?: Yes (appropriate for developmental age)  Prior Inpatient Therapy Prior Inpatient Therapy:  (Denies inpatient treatment in the past- documented different)  Prior Outpatient Therapy Prior Outpatient Therapy: Yes Prior Therapy Dates: unknown Prior Therapy Facilty/Provider(s): unknown Annette Montgomery(Susan- therapy) Reason for Treatment: addiction  ADL Screening (condition at time of admission) Patient's cognitive ability adequate to safely complete daily activities?: Yes Is the patient deaf or have difficulty hearing?: No Does the patient have difficulty  concentrating, remembering, or making decisions?: No Patient able to express need for assistance with ADLs?: Yes Does the patient have difficulty dressing or bathing?: No Independently performs ADLs?: Yes (appropriate for developmental age) Does the patient have difficulty walking or climbing stairs?: No Weakness of Legs: None Weakness of Arms/Hands: None  Home Assistive Devices/Equipment Home Assistive Devices/Equipment: None  Therapy Consults (therapy consults require a physician order) PT Evaluation Needed: No OT Evalulation Needed: No SLP Evaluation Needed: No Abuse/Neglect Assessment (Assessment to be complete while patient is alone) Physical Abuse: Denies Verbal Abuse: Denies Sexual Abuse: Denies Exploitation of patient/patient's resources: Denies Self-Neglect: Denies Values / Beliefs Cultural Requests During Hospitalization: None Spiritual Requests During Hospitalization: None Consults Spiritual Care Consult Needed: No Social Work Consult Needed: No Merchant navy officerAdvance Directives (For Healthcare) Does patient have an advance directive?: No Would patient like information on creating an advanced directive?: Yes English as a second language teacher- Educational materials given    Additional Information 1:1 In Past 12 Months?: No CIRT Risk: No Elopement Risk: No Does patient have medical clearance?: Yes     Disposition:  Disposition Initial Assessment Completed for this Encounter: Yes Disposition of Patient: Referred to Other disposition(s):  (Observation unit )  Annette Montgomery 12/30/2014 11:50 AM

## 2014-12-30 NOTE — ED Notes (Signed)
Pt ordered food tray, pt angry an states that she has not ate in over a week. Pt states that she is a vegetarian, and that she has Mal[place]key early today. When pt asked if she would like a salad she got angry and states she does not have any teeth but would like ham for dinner. Pt asking for ativan, and became angry that it was not time for it.

## 2014-12-30 NOTE — ED Notes (Signed)
CALLED STAFFING FOR SITTER. SITTER IS ENROUTE TO ROOM

## 2014-12-30 NOTE — ED Notes (Signed)
Patient denies pain and is resting comfortably.  

## 2014-12-30 NOTE — ED Notes (Signed)
FAXED RELEASE OF INFORMATION PAPERWORK TO [employer] TO GET PT DOSING INFORMATION

## 2014-12-30 NOTE — ED Notes (Signed)
IVC PAPERS FAXED TO [facility]

## 2014-12-30 NOTE — ED Notes (Signed)
URINE CUP TO BEDSIDE, SODA TO BEDSIDE, LUNCH ORDERED

## 2014-12-30 NOTE — ED Notes (Signed)
TECH TO BEDSIDE TO DRAW LABS

## 2014-12-30 NOTE — ED Notes (Signed)
PATIENT HAS ARRIVED ON POD C. SHE COMES AND HAS A LIST OF THINGS SHE NEEDS "RIGHT NOW". EXPLAINED TO PATIENT THAT SHE HAS JUST ARRIVED ON OUR UNIT AND HAS NOT MADE IT TO HER ROOM AS  YET. REASSURED HER THAT WE WILL GET HER SETTLED.

## 2014-12-30 NOTE — ED Notes (Signed)
CALLED REPORT . KIM REQUESTS PT ARRIVE BTWN 2 AND 3 SO SHE CAN SETTLE IN HER PT THAT IS EXPECTED ANY TIME

## 2014-12-30 NOTE — ED Notes (Signed)
Pt belongings inventoried & placed in designated area, valuables placed in valuables envelope & given to security, belongings documented on paper belongings log

## 2014-12-30 NOTE — ED Provider Notes (Signed)
CSN: [id number]     Arrival date & time 12/30/14  0940 History   First MD Initiated Contact with Patient 12/30/14 [phone]     Chief Complaint  Patient presents with  . Addiction Problem  . Suicidal     (Consider location/radiation/quality/duration/timing/severity/associated sxs/prior Treatment) HPI Comments: Patient presents in the care of a mobile crisis unit with suicidal ideation, benzodiazepine dependence. Patient goes to a methadone clinic daily. Yesterday she talked with a counselor there regarding her benzodiazepine addiction. She requested help for this. Yesterday she made some statements which were interpreted as being a threat of harming herself, such as she will keep taking benzodiapines until she dies. Patient admits to taking benzodiazepines to where she will black out for a week at a time. It was recommended that she go to ED for psychiatric evaluation. Currently she denies acute suicidal ideation. She states that she was just making general statements yesterday. She is willing to talk with someone about her suicidal thoughts. Otherwise, no alcohol or other substances. Patient denies any medical complaints at this time.  The history is provided by the patient and medical records.    Past Medical History  Diagnosis Date  . Anxiety   . Insomnia   . Neck injury   . Back pain   . Pulmonary embolism   . PTSD (post-traumatic stress disorder)   . Panic attacks   . Polysubstance abuse   . Homelessness    History reviewed. No pertinent past surgical history. No family history on file. History  Substance Use Topics  . Smoking status: Heavy Tobacco Smoker -- 0.50 packs/day for 10 years    Types: Cigarettes  . Smokeless tobacco: Not on file  . Alcohol Use: No   OB History    No data available     Review of Systems  Constitutional: Negative for fever.  HENT: Negative for rhinorrhea and sore throat.   Eyes: Negative for redness.  Respiratory: Negative for cough.    Cardiovascular: Negative for chest pain.  Gastrointestinal: Negative for nausea, vomiting, abdominal pain and diarrhea.  Genitourinary: Negative for dysuria.  Musculoskeletal: Negative for myalgias.  Skin: Negative for rash.  Neurological: Negative for seizures and headaches.      Allergies  Contrast media and Nsaids  Home Medications   Prior to Admission medications   Medication Sig Start Date End Date Taking? Authorizing Provider  butalbital-acetaminophen-caffeine (ESGIC PLUS) 50-500-40 MG per tablet Take 1 tablet by mouth every 6 (six) hours as needed for pain. 12/27/14   Rodolph BongEvan S Corey, MD  clonazePAM (KLONOPIN) 2 MG tablet Take 2 mg by mouth daily as needed for anxiety.    Historical Provider, MD  cyclobenzaprine (FLEXERIL) 5 MG tablet Take 1 tablet (5 mg total) by mouth 3 (three) times daily as needed for muscle spasms. 12/27/14   Rodolph BongEvan S Corey, MD  temazepam (RESTORIL) 30 MG capsule Take 1 capsule (30 mg total) by mouth at bedtime as needed for sleep. 12/06/14   Jennifer Piepenbrink, PA-C   BP 156/92 mmHg  Pulse 61  Temp(Src) 98.7 F (37.1 C) (Oral)  Resp 21  Ht 5\' 6"  (1.676 m)  Wt 120 lb (54.432 kg)  BMI 19.38 kg/m2  SpO2 100% Physical Exam  Constitutional: She appears well-developed and well-nourished.  HENT:  Head: Normocephalic and atraumatic.  Eyes: Conjunctivae are normal. Right eye exhibits no discharge. Left eye exhibits no discharge.  Neck: Normal range of motion. Neck supple.  Cardiovascular: Normal rate, regular rhythm and normal heart sounds.  Pulmonary/Chest: Effort normal and breath sounds normal.  Abdominal: Soft. There is no tenderness.  Neurological: She is alert.  Skin: Skin is warm and dry.  Psychiatric: She has a normal mood and affect.  Nursing note and vitals reviewed.   ED Course  Procedures (including critical care time) Labs Review Labs Reviewed  BASIC METABOLIC PANEL - Abnormal; Notable for the following:    Glucose, Bld 102 (*)    All  other components within normal limits  URINE RAPID DRUG SCREEN (HOSP PERFORMED) - Abnormal; Notable for the following:    Benzodiazepines POSITIVE (*)    Barbiturates POSITIVE (*)    All other components within normal limits  CBC  ETHANOL    Imaging Review No results found.   EKG Interpretation None      10:40 AM Patient seen and examined. Work-up initiated. TTS consult requested.    Vital signs reviewed and are as follows: BP 156/92 mmHg  Pulse 61  Temp(Src) 98.7 F (37.1 C) (Oral)  Resp 21  Ht 5\' 6"  (1.676 m)  Wt 120 lb (54.432 kg)  BMI 19.38 kg/m2  SpO2 100%   2:14 PM [facility]. Holding orders placed. Pt medically cleared.    MDM   Final diagnoses:  Benzodiazepine abuse  Suicidal ideation   [facility]. Pt medically cleared.     Renne CriglerJoshua Costantino Kohlbeck, PA-C 12/30/14 1415  Arby BarretteMarcy Pfeiffer, MD 01/05/15 [id number]

## 2014-12-30 NOTE — Progress Notes (Signed)
CSW received word pt had been accepted to OBS unit in order to monitor for withdrawal sx as well as assist with therapeutic referral needs (pt requesting referrals to SA treatment for benzos/opiates) [facility] but had refused to be transferred. Per Counselor completing TTS assessment and [facility] RN, pt had been inconsistent regarding SI but denies SI upon assessment in ED.  Informed pt has been petitioned for IVC by [facility]. As inpatient tx was not recommended in initial evaluation, pt will need evaluation by psychiatry for disposition recommendation. P.m. CSW to follow up.   Ilean SkillMeghan Bayle Calvo, MSW, LCSWA Clinical Social Work, Disposition  12/30/2014 [phone]

## 2014-12-30 NOTE — ED Provider Notes (Signed)
  Physical Exam  BP 172/93 mmHg  Pulse 64  Temp(Src) 98.5 F (36.9 C) (Oral)  Resp 20  Ht 5\' 6"  (1.676 m)  Wt 120 lb (54.432 kg)  BMI 19.38 kg/m2  SpO2 100%  Physical Exam  ED Course  Procedures  [facility] by Dr Evans LanceKumar      Dartha Rozzell, MD 12/30/14 [phone]

## 2014-12-30 NOTE — ED Notes (Signed)
[facility] HAS RECEIVED PAPERS AND PD IS DELIVERING THEM

## 2014-12-30 NOTE — ED Notes (Signed)
TTS being completed at bedside.  

## 2014-12-30 NOTE — ED Notes (Signed)
Pt in via Mobile Crisis unit, per report pt went to her Methadone Clinic reporting addiction to benzos, mobile crisis was contacted by Methadone staff that reports the pt stated wanting to take benzos all day eventually she would kill herself, pt denies SI, pt states, "It was a general statement." pt takes Methadone 70 mg daily d/t hx of Heroine use, pt A&O x4, follows commands, speaks in complete sentences

## 2014-12-30 NOTE — ED Notes (Signed)
PATIENT HAS CALLED [facility]URSES STATION REQUESTING HER "PAIN MEDICINE". STATES SHE DID NOT DOSE HER METHADONE THIS MORNING.

## 2014-12-31 DIAGNOSIS — F1310 Sedative, hypnotic or anxiolytic abuse, uncomplicated: Secondary | ICD-10-CM | POA: Diagnosis not present

## 2014-12-31 MED ORDER — HYDROXYZINE HCL 50 MG PO TABS: 25.0000 mg | ORAL_TABLET | Freq: Three times a day (TID) | ORAL | Status: DC | PRN

## 2014-12-31 MED ORDER — CYCLOBENZAPRINE HCL 10 MG PO TABS
5.0000 mg | ORAL_TABLET | Freq: Three times a day (TID) | ORAL | Status: DC | PRN
  Administered 2014-12-31: 5 mg via ORAL
  Filled 2014-12-31: qty 1

## 2014-12-31 NOTE — ED Notes (Signed)
Pt. C/o back pain. Tylenol offered. Pt. refused

## 2014-12-31 NOTE — Consult Note (Signed)
[place]   Reason for Consult:Benzodiazepine abuse Referring Physician:  EDP Patient Identification: Annette Montgomery MRN:  [id number] Principal Diagnosis: Benzodiazepine abuse Diagnosis:   Patient Active Problem List   Diagnosis Date Noted  . Benzodiazepine abuse [F13.10] 12/30/2014    Priority: High  . Polysubstance abuse [F19.10] 12/30/2014  . Mood disorder [F39] 11/29/2014    Total Time spent with patient: 25 minutes  Subjective:   Annette Montgomery is a 59 y.o. female patient presenting tot he [facility] for anxiety and benzodiazepine abuse concerns. Pt seen and chart reviewed. Pt seen in the ED 8 times in 6 months. Pt reports that she is concerned about withdrawals, yet she has not taken xanax in 4 days and is medically stable with mild tremor although not concerning for seizure activity. Pt denies suicidal/homicidal ideation and psychosis and does not appear to be responding to internal stimuli. Pt reports "I'm a drug addict, I need help" and presents with good insight into her condition, seeking assistance. Pt reports that she would like to followup outpatient and that she refused the [place] yesterday, causing her to get [employer]. Pt reports that she "didn't understand what that place was" and that she would have "gladly come if I knew it was to just watch me voluntarily". This NP spoke to the EDP about this and Dr. Dwyane Dee and all are in agreement that pt has no committable criteria and can be discharged with resources.    HPI: Annette Montgomery is an 59 y.o. 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 Pt states that she has been using drugs to cope since she lost her brother and close family and friends over 10 years ago. She states that she currently goes to a methadone clinic to receive her daily dose and is prescribed benzodiazapines which she admits to abusing. She  denies SI at this time or any history of SI which is inconsistent with documentation. She states that she lied the last time she was at [facility] and said she was suicidal so she would have a place to stay. She says that she is now homeless and stays in bushes and on the street. Pt denies any HI or A/V hallucinations. She states that she takes around 30m of xanex a day with the last use being 3 days ago. She says that she is having some withdrawal symptoms- shaking, nausea, and weakness. She says she has not eaten in a week and sleeps around 1 hour a night. She states that she was at the methadone clinic trying to get help for her benzo addiction but they misunderstood her and sent mobile crisis to do an evaluation. Mobile crisis sent her to the emergency room to assess for SI. She states that "if she wanted to die she would have just taken all of her benzos but she "obviously doesn't want to die". Pt story is inconsistent with last assessment.   [name] Rankin NP was consulted and recommends pt go to observation unit to monitor for withdrawal symptoms and get her connected with long term treatment for addiction. [facility]   HPI Elements:   Location:  Benzodiazepine abuse Quality:  severe-moderate. Severity:  Moderate-severe. Timing:  Acute. Duration:  Sudden. Context:  Benzodiazepine abuse, chronic, pt ran out of drugs  Past Medical History:  Past Medical History  Diagnosis Date  .  Anxiety   . Insomnia   . Neck injury   . Back pain   . Pulmonary embolism   . PTSD (post-traumatic stress disorder)   . Panic attacks   . Polysubstance abuse   . Homelessness    History reviewed. No pertinent past surgical history. Family History: History reviewed. No pertinent family history. Social History:  History  Alcohol Use No     History  Drug Use  . Yes    Comment: Methadone    History   Social History  . Marital Status: Single     Spouse Name: N/A  . Number of Children: N/A  . Years of Education: N/A   Social History Main Topics  . Smoking status: Heavy Tobacco Smoker -- 0.50 packs/day for 10 years    Types: Cigarettes  . Smokeless tobacco: Not on file  . Alcohol Use: No  . Drug Use: Yes     Comment: Methadone  . Sexual Activity: Yes    Birth Control/ Protection: None   Other Topics Concern  . None   Social History Narrative   Additional Social History:    Pain Medications: Goes to a methadone clinic History of alcohol / drug use?: Yes Name of Substance 1: Opiate (methadone)  1 - Age of First Use: 12 1 - Amount (size/oz): Unspecified  1 - Frequency: daily  1 - Duration: 10 years  1 - Last Use / Amount: Today  Name of Substance 2: benzodiazapines  2 - Age of First Use: Unspecified 2 - Amount (size/oz): 25m 2 - Frequency: daily  2 - Duration: "years" 2 - Last Use / Amount: 3 days ago                 Allergies:   Allergies  Allergen Reactions  . Contrast Media [Iodinated Diagnostic Agents] Nausea And Vomiting  . Erythromycin Nausea And Vomiting  . Nsaids Hives    Labs:  Results for orders placed or performed during the hospital encounter of 12/30/14 (from the past 48 hour(s))  CBC     Status: None   Collection Time: 12/30/14 11:47 AM  Result Value Ref Range   WBC 4.1 4.0 - 10.5 K/uL   RBC 4.69 3.87 - 5.11 MIL/uL   Hemoglobin 14.1 12.0 - 15.0 g/dL   HCT 41.5 36.0 - 46.0 %   MCV 88.5 78.0 - 100.0 fL   MCH 30.1 26.0 - 34.0 pg   MCHC 34.0 30.0 - 36.0 g/dL   RDW 14.3 11.5 - 15.5 %   Platelets 206 150 - 400 K/uL  Basic metabolic panel     Status: Abnormal   Collection Time: 12/30/14 11:47 AM  Result Value Ref Range   Sodium 143 135 - 145 mmol/L   Potassium 3.5 3.5 - 5.1 mmol/L   Chloride 108 101 - 111 mmol/L   CO2 25 22 - 32 mmol/L   Glucose, Bld 102 (H) 70 - 99 mg/dL   BUN 9 6 - 20 mg/dL   Creatinine, Ser 0.85 0.44 - 1.00 mg/dL   Calcium 9.3 8.9 - 10.3 mg/dL   GFR calc non  Af Amer >60 >60 mL/min   GFR calc Af Amer >60 >60 mL/min    Comment: (NOTE) The eGFR has been calculated using the CKD EPI equation. This calculation has not been validated in all clinical situations. eGFR's persistently <90 mL/min signify possible Chronic Kidney Disease.    Anion gap 10 5 - 15  Ethanol     Status:  None   Collection Time: 12/30/14 11:47 AM  Result Value Ref Range   Alcohol, Ethyl (B) <5 <5 mg/dL    Comment:        LOWEST DETECTABLE LIMIT FOR SERUM ALCOHOL IS 11 mg/dL FOR MEDICAL PURPOSES ONLY   Urine rapid drug screen (hosp performed)     Status: Abnormal   Collection Time: 12/30/14 11:55 AM  Result Value Ref Range   Opiates NONE DETECTED NONE DETECTED   Cocaine NONE DETECTED NONE DETECTED   Benzodiazepines POSITIVE (A) NONE DETECTED   Amphetamines NONE DETECTED NONE DETECTED   Tetrahydrocannabinol NONE DETECTED NONE DETECTED   Barbiturates POSITIVE (A) NONE DETECTED    Comment:        DRUG SCREEN FOR MEDICAL PURPOSES ONLY.  IF CONFIRMATION IS NEEDED FOR ANY PURPOSE, NOTIFY LAB WITHIN 5 DAYS.        LOWEST DETECTABLE LIMITS FOR URINE DRUG SCREEN Drug Class       Cutoff (ng/mL) Amphetamine      1000 Barbiturate      200 Benzodiazepine   329 Tricyclics       191 Opiates          300 Cocaine          300 THC              50     Vitals: Blood pressure 154/81, pulse 64, temperature 98.3 F (36.8 C), temperature source Oral, resp. rate 18, height '5\' 6"'  (1.676 m), weight 54.432 kg (120 lb), SpO2 98 %.  Risk to Self: Suicidal Ideation: No Suicidal Intent: No Is patient at risk for suicide?: No Suicidal Plan?: No Access to Means: No What has been your use of drugs/alcohol within the last 12 months?: takes methadone and benzodiazipines How many times?: 0 Other Self Harm Risks: Drug use Triggers for Past Attempts: None known Intentional Self Injurious Behavior: None Risk to Others: Homicidal Ideation: No Thoughts of Harm to Others: No Current  Homicidal Intent: No Current Homicidal Plan: No Access to Homicidal Means: No History of harm to others?: No Assessment of Violence: None Noted Does patient have access to weapons?: No Criminal Charges Pending?: No Does patient have a court date: No Prior Inpatient Therapy: Prior Inpatient Therapy:  (Denies inpatient treatment in the past- documented different) Prior Outpatient Therapy: Prior Outpatient Therapy: Yes Prior Therapy Dates: unknown Prior Therapy Facilty/Provider(s): unknown Manuela Schwartz- therapy) Reason for Treatment: addiction  Current Facility-Administered Medications  Medication Dose Route Frequency Provider Last Rate Last Dose  . acetaminophen (TYLENOL) tablet 650 mg  650 mg Oral Q4H PRN Carlisle Cater, PA-C      . alum & mag hydroxide-simeth (MAALOX/MYLANTA) 200-200-20 MG/5ML suspension 30 mL  30 mL Oral PRN Carlisle Cater, PA-C      . cyclobenzaprine (FLEXERIL) tablet 5 mg  5 mg Oral TID PRN Sherwood Gambler, MD   5 mg at 12/31/14 0416  . gabapentin (NEURONTIN) capsule 300 mg  300 mg Oral TID [name] B Rankin, NP   300 mg at 12/30/14 2153  . hydrOXYzine (ATARAX/VISTARIL) tablet 25 mg  25 mg Oral Q6H PRN [name] B Rankin, NP      . loperamide (IMODIUM) capsule 2-4 mg  2-4 mg Oral PRN [name] B Rankin, NP      . LORazepam (ATIVAN) tablet 1 mg  1 mg Oral Q6H PRN [name] B Rankin, NP      . LORazepam (ATIVAN) tablet 1 mg  1 mg Oral QID [name] B Rankin, NP  1 mg at 12/30/14 2151   Followed by  . LORazepam (ATIVAN) tablet 1 mg  1 mg Oral TID [name] B Rankin, NP       Followed by  . [START ON 01/01/2015] LORazepam (ATIVAN) tablet 1 mg  1 mg Oral BID [name] B Rankin, NP       Followed by  . [START ON 01/03/2015] LORazepam (ATIVAN) tablet 1 mg  1 mg Oral Daily [name] B Rankin, NP      . LORazepam (ATIVAN) tablet 1 mg  1 mg Oral Q8H PRN Carlisle Cater, PA-C   1 mg at 12/30/14 1545  . methadone (DOLOPHINE) tablet 70 mg  70 mg Oral Daily Carlisle Cater, PA-C   70 mg at 12/30/14 1617  .  multivitamin with minerals tablet 1 tablet  1 tablet Oral Daily [name] B Rankin, NP   1 tablet at 12/30/14 1841  . nicotine (NICODERM CQ - dosed in mg/24 hours) patch 21 mg  21 mg Transdermal Daily Carlisle Cater, PA-C   21 mg at 12/30/14 1837  . ondansetron (ZOFRAN) tablet 4 mg  4 mg Oral Q8H PRN Carlisle Cater, PA-C      . ondansetron (ZOFRAN-ODT) disintegrating tablet 4 mg  4 mg Oral Q6H PRN [name] B Rankin, NP      . thiamine (VITAMIN B-1) tablet 100 mg  100 mg Oral Daily [name] B Rankin, NP       Current Outpatient Prescriptions  Medication Sig Dispense Refill  . butalbital-acetaminophen-caffeine (ESGIC PLUS) 50-500-40 MG per tablet Take 1 tablet by mouth every 6 (six) hours as needed for pain. 15 tablet 0  . methadone (DOLOPHINE) 10 MG/ML solution Take 70 mg by mouth daily. DOSE VERIFIED BY [facility]    . temazepam (RESTORIL) 30 MG capsule Take 1 capsule (30 mg total) by mouth at bedtime as needed for sleep. 7 capsule 0  . clonazePAM (KLONOPIN) 2 MG tablet Take 2 mg by mouth daily as needed for anxiety.    . cyclobenzaprine (FLEXERIL) 5 MG tablet Take 1 tablet (5 mg total) by mouth 3 (three) times daily as needed for muscle spasms. (Patient not taking: Reported on 12/30/2014) 90 tablet 1    Musculoskeletal: Strength & Muscle Tone: within normal limits Gait & Station: normal Patient leans: N/A  Psychiatric Specialty Exam:     Blood pressure 154/81, pulse 64, temperature 98.3 F (36.8 C), temperature source Oral, resp. rate 18, height '5\' 6"'  (1.676 m), weight 54.432 kg (120 lb), SpO2 98 %.Body mass index is 19.38 kg/(m^2).  General Appearance: Casual and Disheveled  Eye Contact::  Good  Speech:  Clear and Coherent and Normal Rate  Volume:  Normal  Mood:  Anxious  Affect:  Congruent  Thought Process:  Coherent, Goal Directed and Intact  Orientation:  Full (Time, Place, and Person)  Thought Content:  WDL  Suicidal Thoughts:  No  Homicidal Thoughts:  No  Memory:   Immediate;   Good Recent;   Good Remote;   Good  Judgement:  Fair  Insight:  Good and Fair  Psychomotor Activity:  Normal  Concentration:  Good  Recall:  NA  Fund of Knowledge:Fair  Language: Good  Akathisia:  NA  Handed:  Right  AIMS (if indicated):     Assets:  Desire for Improvement Financial Resources/Insurance Housing  ADL's:  Impaired  Cognition: WNL  Sleep:      Medical Decision Making: Established Problem, Stable/Improving (1)  Treatment Plan Summary: Discharge home  Plan:  See below  Disposition:  -Discharge home -Rescind IVC -Refer to outpatient resources for benzodiazepine abuse -Vistaril 24m tid prn anxiety/withdrawal #21 -Provide bus pass or cab voucher to appointment  *Case reviewed with Dr. KHorald Pollen JElyse Jarvis, FNP-BC 12/31/2014 10:22 AM

## 2014-12-31 NOTE — Progress Notes (Signed)
LCSW met with patient at the bedside with regards to her disposition. Patient will be taken off IVC and discharged to the community.  All resources for shelter, and outpatient SA have been reviewed and given. Patient voices understanding, upset that we cannot get her into shelter and plans to lie to get into DV shelter.  LCSW strongly encouraged patient to no lie as there are many people needing DC shelter in crisis and this would prevent others being served that are in need.  Patient reports she needs 2 bus passes to discharge to [facility]. She is working with her counselor at [employer] to get into treatment facility. LCSW again educated patient that we are not seeking placement and she would have to follow up outpatient.  She is in agreement.  Patient aware she can go to [facility] and [employer] to stay for the evening.  LCSW filled out paperwork to change IVC.  Lane Hacker, MSW Clinical Social Work: Emergency Room [phone]

## 2014-12-31 NOTE — ED Notes (Signed)
All belongings obtained from security and given to patient

## 2014-12-31 NOTE — ED Notes (Signed)
Pt given all belongings from utility room, valuable obtained from security and returned to patient.  Pt given one bus pass per request.

## 2014-12-31 NOTE — ED Notes (Signed)
Report from haley, rn.  Pt care assumed

## 2014-12-31 NOTE — ED Provider Notes (Signed)
[facility] with [facility]. Patient does not meet inpatient criteria. She is not suicidal or homicidal. She does not demonstrate any life-threatening withdrawal at this time. IVC is rescinded. Patient is given outpatient resources by social work. They do not recommend any benzodiazepines on discharge as she is unreliable on amount she is taking. They recommended Vistaril for anxiety type symptoms.  BP 134/72 mmHg  Pulse 66  Temp(Src) 98.4 F (36.9 C) (Oral)  Resp 20  Ht 5\' 6"  (1.676 m)  Wt 120 lb (54.432 kg)  BMI 19.38 kg/m2  SpO2 98%  Glynn OctaveStephen Orly Quimby, MD 12/31/14 1317

## 2014-12-31 NOTE — ED Notes (Signed)
Ordered Meal Tray  

## 2014-12-31 NOTE — ED Notes (Signed)
Pt left without discharge paperwork and prescriptions.

## 2014-12-31 NOTE — ED Notes (Signed)
Dr. Nanavati at the bedside.  

## 2014-12-31 NOTE — ED Notes (Signed)
Pt talking to CSW.

## 2014-12-31 NOTE — ED Notes (Signed)
telepsych being reassessed.

## 2014-12-31 NOTE — Clinical Social Work Note (Signed)
Clinical Social Work Assessment  Patient Details  Name: Annette Montgomery MRN: [id number] Date of Birth: 09/22/1955  Date of referral:  12/31/14               Reason for consult:  Housing Concerns/Homelessness, Substance Use/ETOH Abuse, [employer]                Permission sought to share information with:  Other Permission granted to share information::  Yes, Release of Information Signed [facility] )  Name::     [facility]   Agency::  inquire regardign status of inpateint referral   Relationship::     Contact Information:     Housing/Transportation Living arrangements for the past 2 months:  No permanent address Source of Information:  Patient Patient Interpreter Needed:  None Criminal Activity/Legal Involvement Pertinent to Current Situation/Hospitalization:  No - Comment as needed Significant Relationships:  None Lives with:  Self Do you feel safe going back to the place where you live?  No Need for family participation in patient care:  No (Coment)  Care giving concerns:  none   Office managerocial Worker assessment / plan:  CSW spoke with patient in her ED room.  Patient was cooperative.  Patient states she had been living [place] boyfriend's mother [place]  Patient told CSW that she moved to [place] 4 weeks ago.  However, there are notes from early March of patient visits here.  Patient states that boyfriend's mother "had police remove me" and states that she has been living o n the streets for approximately 2 weeks.  Patient states that she is established with [facility] for [facility]one treatment. Patient stated to CSW that a referral had been made by [facility]  for inpatient  30 day rehab program.  Patient states that she has been abusing Klonopin in addition to taking her prescribed Methadone.   Patient signed release and CSW called to [facility] [phone]).  Spoke with Armed forces technical officerclinical director, Ephriam Knuckleshristian.   Per Ephriam Knuckleshristian, a referral to inpatient was discussed but was never a definite plan. Ephriam KnucklesChristian states there are concerns  about patient's memory as well as psychiatric issues.  [facility]  CSW went back to speak with patient following call to [facility].  Patient seemed upset, but remained calm. Patient asked for referral for "somewhere to sleep tonight." CSW will research possible resources for patient.   Employment status:    Insurance information:  [place] PT Recommendations:    Information / Referral to community resources:  Outpatient Psychiatric Care (Comment Required), Outpatient Substance Abuse Treatment Options, Shelter  Patient/Family's Response to care:  Patient has been demanding at times during this admission, angry.    Patient/Family's Understanding of and Emotional Response to Diagnosis, Current Treatment, and Prognosis:  Patient with limited understanding affecting ability to participate in and comply with treatment plan.   Emotional Assessment Appearance:  Appears older than stated age Attitude/Demeanor/Rapport:  Complaining Affect (typically observed):    Orientation:  Oriented to Self Alcohol / Substance use:  Illicit Drugs Psych involvement (Current and /or in the community):  No (Comment)  Discharge Needs  Concerns to be addressed:  Substance Abuse Concerns, Compliance Issues Concerns, Homelessness Readmission within the last 30 days:  Yes Current discharge risk:  Psychiatric Illness, Substance Abuse Barriers to Discharge:  Active Substance Use   Carie CaddyBarrett-Hilton, Takenya Travaglini D, LCSW  [phone] 12/31/2014, 11:36 AM

## 2014-12-31 NOTE — Discharge Instructions (Signed)
Chemical Dependency  Chemical dependency is an addiction to drugs or alcohol. It is characterized by the repeated behavior of seeking out and using drugs and alcohol despite harmful consequences to the health and safety of ones self and others.   RISK FACTORS  There are certain situations or behaviors that increase a person's risk for chemical dependency. These include:  · A family history of chemical dependency.  · A history of mental health issues, including depression and anxiety.  · A home environment where drugs and alcohol are easily available to you.  · Drug or alcohol use at a young age.  SYMPTOMS   The following symptoms can indicate chemical dependency:  · Inability to limit the use of drugs or alcohol.  · Nausea, sweating, shakiness, and anxiety that occurs when alcohol or drugs are not being used.  · An increase in amount of drugs or alcohol that is necessary to get drunk or high.  People who experience these symptoms can assess their use of drugs and alcohol by asking themselves the following questions:  · Have you been told by friends or family that they are worried about your use of alcohol or drugs?  · Do friends and family ever tell you about things you did while drinking alcohol or using drugs that you do not remember?  · Do you lie about using alcohol or drugs or about the amounts you use?  · Do you have difficulty completing daily tasks unless you use alcohol or drugs?  · Is the level of your work or school performance lower because of your drug or alcohol use?  · Do you get sick from using drugs or alcohol but keep using anyway?  · Do you feel uncomfortable in social situations unless you use alcohol or drugs?  · Do you use drugs or alcohol to help forget problems?   An answer of yes to any of these questions may indicate chemical dependency. Professional evaluation is suggested.  Document Released: 08/07/2001 Document Revised: 11/05/2011 Document Reviewed: 10/19/2010  ExitCare® Patient  Information ©2015 [employer]. This information is not intended to replace advice given to you by your health care provider. Make sure you discuss any questions you have with your health care provider.

## 2014-12-31 NOTE — Progress Notes (Addendum)
Spoke with Renata Capriceonrad, NP, who states pt is recommended for d/c to follow up with SA resources. CSW spoke with covering [facility] SW regarding pt's disposition. SW to provide SA resources and this Clinical research associatewriter to fax additional resources if needed.   CSW faxed list of SA resources and shelters in area.  Ilean SkillMeghan Jonesha Tsuchiya, MSW, LCSWA Clinical Social Work, Disposition  12/31/2014 [phone]

## 2014-12-31 NOTE — ED Notes (Signed)
Spoke with Minerva AreolaEric, Merchandiser[facility]. States that pt will be reassess and potentially IVC resended and able to go home.

## 2015-01-05 ENCOUNTER — Emergency Department (HOSPITAL_COMMUNITY): Payer: Medicaid Other

## 2015-01-05 ENCOUNTER — Encounter (HOSPITAL_COMMUNITY): Payer: Self-pay | Admitting: Emergency Medicine

## 2015-01-05 ENCOUNTER — Observation Stay (HOSPITAL_COMMUNITY)
Admission: EM | Admit: 2015-01-05 | Discharge: 2015-01-07 | Disposition: A | Discharge status: Home / Self Care | Payer: Medicaid Other | Attending: Internal Medicine | Admitting: Internal Medicine

## 2015-01-05 DIAGNOSIS — X58XXXD Exposure to other specified factors, subsequent encounter: Secondary | ICD-10-CM

## 2015-01-05 DIAGNOSIS — Z590 Homelessness: Secondary | ICD-10-CM | POA: Diagnosis not present

## 2015-01-05 DIAGNOSIS — R053 Chronic cough: Secondary | ICD-10-CM

## 2015-01-05 DIAGNOSIS — R0602 Shortness of breath: Secondary | ICD-10-CM | POA: Diagnosis present

## 2015-01-05 DIAGNOSIS — R030 Elevated blood-pressure reading, without diagnosis of hypertension: Secondary | ICD-10-CM

## 2015-01-05 DIAGNOSIS — F419 Anxiety disorder, unspecified: Secondary | ICD-10-CM | POA: Diagnosis present

## 2015-01-05 DIAGNOSIS — F1910 Other psychoactive substance abuse, uncomplicated: Secondary | ICD-10-CM | POA: Diagnosis present

## 2015-01-05 DIAGNOSIS — G4700 Insomnia, unspecified: Secondary | ICD-10-CM | POA: Diagnosis not present

## 2015-01-05 DIAGNOSIS — R05 Cough: Secondary | ICD-10-CM

## 2015-01-05 DIAGNOSIS — F1120 Opioid dependence, uncomplicated: Secondary | ICD-10-CM | POA: Insufficient documentation

## 2015-01-05 DIAGNOSIS — Z79891 Long term (current) use of opiate analgesic: Secondary | ICD-10-CM

## 2015-01-05 DIAGNOSIS — J441 Chronic obstructive pulmonary disease with (acute) exacerbation: Principal | ICD-10-CM | POA: Insufficient documentation

## 2015-01-05 DIAGNOSIS — J9601 Acute respiratory failure with hypoxia: Secondary | ICD-10-CM | POA: Insufficient documentation

## 2015-01-05 DIAGNOSIS — F1210 Cannabis abuse, uncomplicated: Secondary | ICD-10-CM

## 2015-01-05 DIAGNOSIS — F4312 Post-traumatic stress disorder, chronic: Secondary | ICD-10-CM

## 2015-01-05 DIAGNOSIS — F17210 Nicotine dependence, cigarettes, uncomplicated: Secondary | ICD-10-CM | POA: Insufficient documentation

## 2015-01-05 DIAGNOSIS — J189 Pneumonia, unspecified organism: Secondary | ICD-10-CM | POA: Diagnosis not present

## 2015-01-05 HISTORY — DX: Pneumonia, unspecified organism: J18.9

## 2015-01-05 LAB — CBC
HEMATOCRIT: 42.7 % (ref 36.0–46.0)
Hemoglobin: 14.3 g/dL (ref 12.0–15.0)
MCH: 29.7 pg (ref 26.0–34.0)
MCHC: 33.5 g/dL (ref 30.0–36.0)
MCV: 88.8 fL (ref 78.0–100.0)
PLATELETS: 180 10*3/uL (ref 150–400)
RBC: 4.81 MIL/uL (ref 3.87–5.11)
RDW: 13.7 % (ref 11.5–15.5)
WBC: 10.4 10*3/uL (ref 4.0–10.5)

## 2015-01-05 LAB — BASIC METABOLIC PANEL
ANION GAP: 12 (ref 5–15)
BUN: 15 mg/dL (ref 6–20)
CO2: 23 mmol/L (ref 22–32)
Calcium: 9 mg/dL (ref 8.9–10.3)
Chloride: 100 mmol/L — ABNORMAL LOW (ref 101–111)
Creatinine, Ser: 0.81 mg/dL (ref 0.44–1.00)
GFR calc Af Amer: >60 mL/min (ref >60)
GFR calc non Af Amer: >60 mL/min (ref >60)
Glucose, Bld: 101 mg/dL — ABNORMAL HIGH (ref 70–99)
POTASSIUM: 3.6 mmol/L (ref 3.5–5.1)
Sodium: 135 mmol/L (ref 135–145)

## 2015-01-05 LAB — CBC WITH DIFFERENTIAL/PLATELET
BASOS ABS: 0 10*3/uL (ref 0.0–0.1)
BASOS PCT: 1 % (ref 0–1)
Eosinophils Absolute: 0.1 10*3/uL (ref 0.0–0.7)
Eosinophils Relative: 1 % (ref 0–5)
HCT: 43.3 % (ref 36.0–46.0)
Hemoglobin: 14.2 g/dL (ref 12.0–15.0)
Lymphocytes Relative: 20 % (ref 12–46)
Lymphs Abs: 1.5 10*3/uL (ref 0.7–4.0)
MCH: 29.2 pg (ref 26.0–34.0)
MCHC: 32.8 g/dL (ref 30.0–36.0)
MCV: 89.1 fL (ref 78.0–100.0)
Monocytes Absolute: 0.7 10*3/uL (ref 0.1–1.0)
Monocytes Relative: 9 % (ref 3–12)
NEUTROS ABS: 5.5 10*3/uL (ref 1.7–7.7)
Neutrophils Relative %: 69 % (ref 43–77)
Platelets: 199 10*3/uL (ref 150–400)
RBC: 4.86 MIL/uL (ref 3.87–5.11)
RDW: 13.8 % (ref 11.5–15.5)
WBC: 7.9 10*3/uL (ref 4.0–10.5)

## 2015-01-05 LAB — CREATININE, SERUM
CREATININE: 0.92 mg/dL (ref 0.44–1.00)
GFR calc Af Amer: >60 mL/min (ref >60)
GFR calc non Af Amer: >60 mL/min (ref >60)

## 2015-01-05 LAB — I-STAT TROPONIN, ED: Troponin i, poc: 0 ng/mL (ref 0.00–0.08)

## 2015-01-05 LAB — D-DIMER, QUANTITATIVE: D-Dimer, Quant: 0.53 ug/mL-FEU — ABNORMAL HIGH (ref 0.00–0.48)

## 2015-01-05 MED ORDER — IPRATROPIUM-ALBUTEROL 0.5-2.5 (3) MG/3ML IN SOLN
3.0000 mL | Freq: Once | RESPIRATORY_TRACT | Status: AC
  Administered 2015-01-05: 3 mL via RESPIRATORY_TRACT
  Filled 2015-01-05: qty 3

## 2015-01-05 MED ORDER — ZOLPIDEM TARTRATE 5 MG PO TABS
5.0000 mg | ORAL_TABLET | Freq: Every evening | ORAL | Status: DC | PRN
  Administered 2015-01-05 – 2015-01-06 (×2): 5 mg via ORAL
  Filled 2015-01-05 (×2): qty 1

## 2015-01-05 MED ORDER — LEVOFLOXACIN 750 MG PO TABS
750.0000 mg | ORAL_TABLET | Freq: Once | ORAL | Status: AC
  Administered 2015-01-05: 750 mg via ORAL
  Filled 2015-01-05: qty 1

## 2015-01-05 MED ORDER — HYDROXYZINE HCL 25 MG PO TABS: 25.0000 mg | ORAL_TABLET | Freq: Three times a day (TID) | ORAL | Status: DC | PRN

## 2015-01-05 MED ORDER — CETYLPYRIDINIUM CHLORIDE 0.05 % MT LIQD
7.0000 mL | Freq: Two times a day (BID) | OROMUCOSAL | Status: DC
  Administered 2015-01-05 – 2015-01-06 (×3): 7 mL via OROMUCOSAL

## 2015-01-05 MED ORDER — METHADONE HCL 10 MG PO TABS
70.0000 mg | ORAL_TABLET | Freq: Every day | ORAL | Status: DC
  Administered 2015-01-06 – 2015-01-07 (×2): 70 mg via ORAL
  Filled 2015-01-05 (×2): qty 7

## 2015-01-05 MED ORDER — METHYLPREDNISOLONE SODIUM SUCC 125 MG IJ SOLR
125.0000 mg | Freq: Once | INTRAMUSCULAR | Status: AC
  Administered 2015-01-05: 125 mg via INTRAVENOUS
  Filled 2015-01-05: qty 2

## 2015-01-05 MED ORDER — LEVOFLOXACIN 750 MG PO TABS
750.0000 mg | ORAL_TABLET | ORAL | Status: DC
  Administered 2015-01-06 – 2015-01-07 (×2): 750 mg via ORAL
  Filled 2015-01-05 (×2): qty 1

## 2015-01-05 MED ORDER — IPRATROPIUM-ALBUTEROL 0.5-2.5 (3) MG/3ML IN SOLN
3.0000 mL | RESPIRATORY_TRACT | Status: DC
  Administered 2015-01-05 – 2015-01-06 (×4): 3 mL via RESPIRATORY_TRACT
  Filled 2015-01-05 (×5): qty 3

## 2015-01-05 MED ORDER — METHADONE HCL 10 MG/ML PO CONC: 70.0000 mg | Freq: Every day | ORAL | Status: DC

## 2015-01-05 MED ORDER — ENOXAPARIN SODIUM 30 MG/0.3ML ~~LOC~~ SOLN
30.0000 mg | SUBCUTANEOUS | Status: DC
  Administered 2015-01-06: 30 mg via SUBCUTANEOUS
  Filled 2015-01-05 (×3): qty 0.3

## 2015-01-05 MED ORDER — PREDNISONE 20 MG PO TABS
40.0000 mg | ORAL_TABLET | Freq: Every day | ORAL | Status: DC
Start: 2015-01-06 — End: 2015-01-07
  Administered 2015-01-06 – 2015-01-07 (×2): 40 mg via ORAL
  Filled 2015-01-05 (×3): qty 2
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
## 2015-01-05 NOTE — ED Notes (Signed)
Pt placed on oxygen at 2 liters to keep her sats above 90%

## 2015-01-05 NOTE — Progress Notes (Signed)
LCSW very familiar with patient from most recent admission to the ED for detox and homelessness.  LCSW consulted with PA regarding consult.  Patient remains homeless and needing resources. LCSW reviewed options with patient specifically [facility] and [facility]. Shelter List was given last time as well and patient has copies.  Patient is working with her outpatient treatment facility: [facility] in effort to get into treatment.  At this time patient will need to be discharged back to [facility]/shelter as there are no other available options.  Patient received 2 bus passes in last admission. If transportation is an issue will only be able to give one.   Annette EmoryHannah Waynette Towers LCSW, MSW Clinical Social Work: Emergency Room [phone]

## 2015-01-05 NOTE — Progress Notes (Signed)
Pt BP 171/78 MD aware.

## 2015-01-05 NOTE — ED Notes (Signed)
Placed pt on 2 liters of oxygen nasal cannula due to low o2 saturations

## 2015-01-05 NOTE — Progress Notes (Signed)
Pt asking for something to eat and to drink. RN and MD aware.

## 2015-01-05 NOTE — Progress Notes (Signed)
NURSING PROGRESS NOTE  Annette Montgomery [id number] Admission Data: 01/05/2015 4:16 PM Attending Provider: Burns SpainElizabeth A Butcher, MD PCP:Pcp Not In System Code Status: full   Annette SamStacey Ann Montgomery is a 59 y.o. female patient admitted from ED:  -No acute distress noted.  -No complaints of shortness of breath.  -No complaints of chest pain.     Blood pressure 141/108, pulse 73, temperature 100 F (37.8 C), temperature source Oral, resp. rate 18, height 5\' 7"  (1.702 m), weight 56.246 kg (124 lb), SpO2 94 %.   IV Fluids:  IV in place, occlusive dsg intact without redness, IV cath forearm right, condition patent and no redness normal saline lock.   Allergies:  Contrast media; Erythromycin; and Nsaids  Past Medical History:   has a past medical history of Anxiety; Insomnia; Neck injury; Back pain; Pulmonary embolism (2015); PTSD (post-traumatic stress disorder); Panic attacks; Polysubstance abuse; Homelessness; and Pneumonia (2014).  Past Surgical History:   has no past surgical history on file.  Social History:   reports that she has been smoking Cigarettes.  She has a 5 pack-year smoking history. She does not have any smokeless tobacco history on file. She reports that she uses illicit drugs. She reports that she does not drink alcohol.  Skin: scattered bruises.  Patient/Family orientated to room. Information packet given to patient/family. Admission inpatient armband information verified with patient/family to include name and date of birth and placed on patient arm. Side rails up x 2, fall assessment and education completed with patient/family. Patient/family able to verbalize understanding of risk associated with falls and verbalized understanding to call for assistance before getting out of bed. Call light within reach. Patient/family able to voice and demonstrate understanding of unit orientation instructions.    Will continue to evaluate and treat per MD orders.

## 2015-01-05 NOTE — ED Notes (Signed)
Ambulated pt on the unit with pulse oximetry pt saturations remained at 88%

## 2015-01-05 NOTE — ED Notes (Signed)
Pt. reports persistent productive cough with SOB onset this week , denies chest pain or chest tightness, heavy smoker / homeless. Denies fever or chills.
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
## 2015-01-05 NOTE — ED Notes (Signed)
Admitting md at bedside to eval pt 

## 2015-01-05 NOTE — ED Notes (Signed)
Admitting MD at the bedside.  

## 2015-01-05 NOTE — ED Notes (Signed)
Stepped in with [name], CSW to speak with pt about her concerns.

## 2015-01-05 NOTE — Progress Notes (Signed)
Pharmacy Note Asked to adjust antibiotics for renal function. She is currently on levaquin 750 mg po daily which is appropriate for her renal function. Thank you, Talbert CageLora Eppie Barhorst, PharmD

## 2015-01-05 NOTE — ED Provider Notes (Signed)
CSN: [id number]     Arrival date & time 01/05/15  [id number] History   First MD Initiated Contact with Patient 01/05/15 [phone]     Chief Complaint  Patient presents with  . Cough  . Shortness of Breath     (Consider location/radiation/quality/duration/timing/severity/associated sxs/prior Treatment) HPI  Annette Montgomery is a 59 y.o. female presenting with 2 days of shortness of breath with subjective fevers, and cough productive of thick white mucous. Pt states SOB worse with activity. No chest pain, chest tightness. Patient is a smoker. She has not taken anything for her symptoms. She states it feels like last and she had pneumonia. No hypertension, diabetes, dyslipidemia, obesity, family history cardiac disease. Pt denies history of DVT, recent surgery or trauma, malignancy, hemoptysis, exogenous estrogen use, unilateral leg swelling or tenderness, immobilization. Pt states she had PE years ago and treated with anticoagulants for 6 months.     Past Medical History  Diagnosis Date  . Anxiety   . Insomnia   . Neck injury   . Back pain   . Pulmonary embolism   . PTSD (post-traumatic stress disorder)   . Panic attacks   . Polysubstance abuse   . Homelessness   . Pneumonia    History reviewed. No pertinent past surgical history. No family history on file. History  Substance Use Topics  . Smoking status: Heavy Tobacco Smoker -- 0.50 packs/day for 10 years    Types: Cigarettes  . Smokeless tobacco: Not on file  . Alcohol Use: No   OB History    No data available     Review of Systems 10 Systems reviewed and are negative for acute change except as noted in the HPI.    Allergies  Contrast media; Erythromycin; and Nsaids  Home Medications   Prior to Admission medications   Medication Sig Start Date End Date Taking? Authorizing Provider  butalbital-acetaminophen-caffeine (ESGIC PLUS) 50-500-40 MG per tablet Take 1 tablet by mouth every 6 (six) hours as needed for pain. 12/27/14   Yes Rodolph BongEvan S Corey, MD  methadone (DOLOPHINE) 10 MG/ML solution Take 70 mg by mouth daily. DOSE VERIFIED BY [facility] 12/29/14  Yes Historical Provider, MD  hydrOXYzine (ATARAX/VISTARIL) 50 MG tablet Take 0.5 tablets (25 mg total) by mouth every 8 (eight) hours as needed for anxiety. Patient not taking: Reported on 01/05/2015 12/31/14   Glynn OctaveStephen Rancour, MD   BP 161/94 mmHg  Pulse 73  Temp(Src) 99.6 F (37.6 C) (Oral)  Resp 14  Ht 5\' 7"  (1.702 m)  Wt 124 lb (56.246 kg)  BMI 19.42 kg/m2  SpO2 91% Physical Exam  Constitutional: She appears well-developed and well-nourished. No distress.  HENT:  Head: Normocephalic and atraumatic.  Eyes: Conjunctivae and EOM are normal. Right eye exhibits no discharge. Left eye exhibits no discharge.  Neck: No JVD present.  Cardiovascular: Normal rate and regular rhythm.   No leg swelling or tenderness. Negative Homan's sign.  Pulmonary/Chest: Effort normal and breath sounds normal. No respiratory distress.  Decreased air movement throughout with wheezing greater on left than right without respiratory distress. Equal chest excursion.  Abdominal: Soft. Bowel sounds are normal. She exhibits no distension. There is no tenderness.  Neurological: She is alert. She exhibits normal muscle tone. Coordination normal.  Skin: Skin is warm and dry. She is not diaphoretic.  Nursing note and vitals reviewed.   ED Course  Procedures (including critical care time) Labs Review Labs Reviewed  CBC WITH DIFFERENTIAL/PLATELET  BASIC METABOLIC PANEL  Rosezena SensorI-STAT TROPOININ, ED    Imaging Review Dg Chest 2 View  01/05/2015   CLINICAL DATA:  59 year old female with cough and shortness of Breath. Previous pneumonia. Initial encounter.  EXAM: CHEST  2 VIEW  COMPARISON:  None.  FINDINGS: Large lung volumes with increased AP dimension to the chest. No pneumothorax, pulmonary edema, pleural effusion or consolidation. Curvilinear and streaky opacity in the lingula.  Normal cardiac size and mediastinal contours. Calcified atherosclerosis of the aorta. Visualized tracheal air column is within normal limits. No acute osseous abnormality identified.  IMPRESSION: Hyperinflation. Streaky opacity in the lingula without prior exam could reflect acute infectious exacerbation or chronic scarring. No pleural effusion.   Electronically Signed   By: Odessa FlemingH  Hall M.D.   On: 01/05/2015 07:13     EKG Interpretation   Date/Time:  Wednesday Jan 05 2015 06:45:00 EDT Ventricular Rate:  74 PR Interval:  138 QRS Duration: 90 QT Interval:  392 QTC Calculation: 435 R Axis:   80 Text Interpretation:  Normal sinus rhythm Right atrial enlargement  Borderline ECG No old tracing to compare Confirmed by OTTER  MD, OLGA  ([id number]) on 01/05/2015 6:47:08 AM      MDM   Final diagnoses:  SOB (shortness of breath)  Persistent cough   Pt presents with SOB with cough productive of thick mucus. No fevers. Patient afebrile. Patient with oxygen desaturations to 87% she was placed on oxygen with improvement of her symptoms. Lungs decreased air movement throughout and wheezing worsened left. Labwork reassuring. Negative troponin and EKG without evidence of ischemia. Chest x-ray with streaky opacity possibly reflecting pneumonia.  Patient likely COPD exacerbation. She is heavy smoker. Patient ambulated in ED without oxygen with saturations 86-87%. Patient does not use home oxygen. Consult to internal medicine for admission for COPD exacerbation with hypoxia.  Discussed all results and patient verbalizes understanding and agrees with plan.  This is a shared patient. This patient was discussed with the physician who saw and evaluated the patient and agrees with the plan.     Oswaldo ConroyVictoria Equan Cogbill, PA-C 01/07/15 [id number]  Tilden FossaElizabeth Rees, MD 01/07/15 [phone]

## 2015-01-05 NOTE — Progress Notes (Signed)
RN called for report.  

## 2015-01-06 DIAGNOSIS — F4312 Post-traumatic stress disorder, chronic: Secondary | ICD-10-CM | POA: Diagnosis not present

## 2015-01-06 DIAGNOSIS — Z79891 Long term (current) use of opiate analgesic: Secondary | ICD-10-CM | POA: Diagnosis not present

## 2015-01-06 DIAGNOSIS — J441 Chronic obstructive pulmonary disease with (acute) exacerbation: Secondary | ICD-10-CM | POA: Diagnosis not present

## 2015-01-06 DIAGNOSIS — F419 Anxiety disorder, unspecified: Secondary | ICD-10-CM | POA: Diagnosis not present

## 2015-01-06 LAB — STREP PNEUMONIAE URINARY ANTIGEN: Strep Pneumo Urinary Antigen: NEGATIVE

## 2015-01-06 LAB — BASIC METABOLIC PANEL
Anion gap: 10 (ref 5–15)
BUN: 16 mg/dL (ref 6–20)
CO2: 24 mmol/L (ref 22–32)
Calcium: 9.6 mg/dL (ref 8.9–10.3)
Chloride: 103 mmol/L (ref 101–111)
Creatinine, Ser: 0.81 mg/dL (ref 0.44–1.00)
GFR calc Af Amer: >60 mL/min (ref >60)
GLUCOSE: 121 mg/dL — AB (ref 65–99)
POTASSIUM: 3.9 mmol/L (ref 3.5–5.1)
Sodium: 137 mmol/L (ref 135–145)

## 2015-01-06 LAB — HIV ANTIBODY (ROUTINE TESTING W REFLEX): HIV Screen 4th Generation wRfx: NONREACTIVE

## 2015-01-06 MED ORDER — PREDNISONE 20 MG PO TABS: 40.0000 mg | ORAL_TABLET | Freq: Every day | ORAL | Status: DC

## 2015-01-06 MED ORDER — ALBUTEROL SULFATE HFA 108 (90 BASE) MCG/ACT IN AERS: 2.0000 | INHALATION_SPRAY | Freq: Four times a day (QID) | RESPIRATORY_TRACT | Status: DC | PRN

## 2015-01-06 MED ORDER — IPRATROPIUM-ALBUTEROL 0.5-2.5 (3) MG/3ML IN SOLN
3.0000 mL | Freq: Four times a day (QID) | RESPIRATORY_TRACT | Status: DC
  Administered 2015-01-06 – 2015-01-07 (×2): 3 mL via RESPIRATORY_TRACT
  Filled 2015-01-06 (×2): qty 3

## 2015-01-06 MED ORDER — LEVOFLOXACIN 750 MG PO TABS
750.0000 mg | ORAL_TABLET | ORAL | Status: DC
Start: 2015-01-07 — End: 2015-01-07

## 2015-01-06 NOTE — Discharge Summary (Signed)
Name: Annette Montgomery MRN: [id number] DOB: 02/03/1956 59 y.o. PCP: Pcp Not In System  Date of Admission: 01/05/2015  7:50 AM Date of Discharge: 01/07/2015 Attending Physician: Burns SpainElizabeth A Butcher, MD  Discharge Diagnosis: Principal Problem:   COPD exacerbation Active Problems:   Polysubstance abuse   CAP (community acquired pneumonia)   Anxiety   Methadone dependence  Discharge Medications:   Medication List    TAKE these medications        albuterol 108 (90 BASE) MCG/ACT inhaler  Commonly known as:  PROVENTIL HFA;VENTOLIN HFA  Inhale 2 puffs into the lungs every 6 (six) hours as needed for wheezing or shortness of breath.     butalbital-acetaminophen-caffeine 50-500-40 MG per tablet  Commonly known as:  ESGIC PLUS  Take 1 tablet by mouth every 6 (six) hours as needed for pain.     dextromethorphan-guaiFENesin 30-600 MG per 12 hr tablet  Commonly known as:  MUCINEX DM  Take 1 tablet by mouth 2 (two) times daily.     hydrOXYzine 50 MG tablet  Commonly known as:  ATARAX/VISTARIL  Take 0.5 tablets (25 mg total) by mouth every 8 (eight) hours as needed for anxiety.     levofloxacin 750 MG tablet  Commonly known as:  LEVAQUIN  Take 1 tablet (750 mg total) by mouth daily.  Start taking on:  01/08/2015     methadone 10 MG/ML solution  Commonly known as:  DOLOPHINE  Take 70 mg by mouth daily. DOSE VERIFIED BY [facility]     predniSONE 20 MG tablet  Commonly known as:  DELTASONE  Take 2 tablets (40 mg total) by mouth daily with breakfast.  Start taking on:  01/08/2015        Disposition and follow-up:   [name] was [facility] in Stable condition.  At the hospital follow up visit please address:  1.  COPD/possible PNA: Assess continued improvement in symptoms, at some point patient may benefit from PFTs but would defer to PCP in [place].  2. Social Issues/ Homelessness: patient reports [employer] administration  has her social security debit card, when she picks this up she plan to return to [place] when she has a better support system.  3. Elevated BP: please reassess  2.  Labs / imaging needed at time of follow-up: None  3.  Pending labs/ test needing follow-up: None  Follow-up Appointments: Follow-up Information    [facility]   Contact information:   Can have mail sent there if you have no address, you can get medications, and be seen by a Nurse Practicioner. [phone]      Follow [facility] with [facility] On 01/12/2015.   Why:  Appointment at 12:00. Please come 15 minutes prior to your appointment time. If you cannot make your appointment call to reschedule. Due to not showing for appointment previously, this will be your last opportunity to establish care at clinic and pharmac   Contact information:   [address]  [place] [phone]      Discharge Instructions: Discharge Instructions    Call MD for:  difficulty breathing, headache or visual disturbances    Complete by:  As directed      Call MD for:  difficulty breathing, headache or visual disturbances    Complete by:  As directed      Call MD for:  temperature >100.4    Complete  by:  As directed      Call MD for:  temperature >100.4    Complete by:  As directed      Diet - low sodium heart healthy    Complete by:  As directed      Diet - low sodium heart healthy    Complete by:  As directed      Increase activity slowly    Complete by:  As directed      Increase activity slowly    Complete by:  As directed            Consultations:    Procedures Performed:  Dg Chest 2 View  01/05/2015   CLINICAL DATA:  59 year old female with cough and shortness of Breath. Previous pneumonia. Initial encounter.  EXAM: CHEST  2 VIEW  COMPARISON:  None.  FINDINGS: Large lung volumes with increased AP dimension to the chest. No pneumothorax,  pulmonary edema, pleural effusion or consolidation. Curvilinear and streaky opacity in the lingula. Normal cardiac size and mediastinal contours. Calcified atherosclerosis of the aorta. Visualized tracheal air column is within normal limits. No acute osseous abnormality identified.  IMPRESSION: Hyperinflation. Streaky opacity in the lingula without prior exam could reflect acute infectious exacerbation or chronic scarring. No pleural effusion.   Electronically Signed   By: Odessa Fleming M.D.   On: 01/05/2015 07:13    Admission HPI: Annette Montgomery is a 59 yo white female with a PMH of polysubstance abuse, methadone dependence, PTSD, homelessness, tobacco abuse. Who presents to [facility] today with CC of dyspnea for the last 2 days. She reports this is associated with a productive cough of white and yellow sputum and some wheezing. She denies any fever, chills, chest pain, calf pain, dysuria, myalgias. She does reports that she has been generally feeling less well since her methadone clinic here in [place] cut her dose of Methadone to  from  due to positive BZDs which she did not know she could not have. She reports that she came to  from [place] 6 weeks ago to help a homeless female friend return home to his mother, after she ensured this happened she was kicked out by his mother and had to walk from [place] to [place], along the way she lost her identification, and money and has been stranded in [place]. She has been able to establish with a methadone clinic which has helped her get new identification and get a new card for there social security deposits. She has occasionally been sleeping outdoors because she has not found a homeless center that will accept her. She does report she has been having some intermittent short term memory loss recently but denies any syncope.  In the ED, her O2 saturations droped to 86-87% on ambulation.  Hospital Course by problem list: Acute COPD exacerbation +/- Community  Acquired Pneumonia: Patient presented with hypoxia with base oxygen saturation of 86% on room air. Patient reported SOB, productive cough, neck and back pain, and congestion. Chest x-ray revealed a streaky opacity in the lingula that could refelct acute infection or chronic scarring. Patient has had two prior episodes of pneumonia, once with sepsis, in the previous three years. Patient remained afebrile throughout admission. Labs were unremarkable.  HIV antibody and urine strep pneumonia were negative. Patient received IV Solumedrol in ED. Patient was begun on oral prednisone x4 days, Duonebs, and oral Levaquin x5 days. Patient was weaned off oxygen with satisfactory saturations. Patient was discharged on oral prednisone and Levaquin. Please  consider PFTs for outpatient workup of COPD.  Anxiety/PTSD: Per patient report, patient takes Klonopin 0.5mg  BID in [place], however patient has not taken a benzo in one week because of local methadone clinic policies regarding benzo use and methadone treatment. Per Narcotics Database search, it appears she filled a #30 of 1mg  Klonopin 2/27 from [facility], then a #7 of Temazepam 15mg  [facility] on 3/22, then a #7 [facility] 30mg  [facility] on 4/11, and a #30 of Temazepam 30mg  from Len BlalockNykendrtra Brown NP (who appears to work in [place]). Patient's home Klonopin was held during admission. Patient was offered hydroxyzine for anxiety.    Methadone dependence: Patient states she is on a 120mg  methadone regimen in [place] and has been on methadone for the last 20 years. Patient's dose was decreased from 120mg  to 70mg  at methadone clinic in [place] due to UDS positive for benzos. Patient was managed on Methadone 70mg  daily during hospitalization. Patient did not demonstrate signs of benzo withdrawal except for elevated blood pressure. BP during recent ED visits was normal. Please evaluate blood pressure at next outpatient appointment.  Polysubstance Abuse: UDS positive  for benzodiazepines and barbiturates on 12/30/14. Patient was managed on Methadone 70mg  daily during hospitalization.  Insomnia: Patient was offered Ambien PRN for sleep while inpatient.    Discharge Vitals:   BP 157/75 mmHg  Pulse 65  Temp(Src) 98.5 F (36.9 C) (Oral)  Resp 20  Ht 5\' 7"  (1.702 m)  Wt 124 lb (56.246 kg)  BMI 19.42 kg/m2  SpO2 92%  Discharge Labs:  No results found for this or any previous visit (from the past 24 hour(s)).  Signed: Gust RungErik C Auguste Tebbetts, DO 01/07/2015, 12:55 PM    Services Ordered on Discharge: none Equipment Ordered on Discharge: none

## 2015-01-06 NOTE — Progress Notes (Signed)
Subjective: Patient feeling much better, very appreciative for her care.  Plans to leave within next week for [place] Objective: Vital signs in last 24 hours: Filed Vitals:   01/05/15 2134 01/06/15 0340 01/06/15 0535 01/06/15 0842  BP: 143/62  165/64   Pulse: 72  72 72  Temp: 98.2 F (36.8 C)  98.4 F (36.9 C)   TempSrc: Oral  Oral   Resp: 18  18 18   Height:      Weight:      SpO2: 93% 93% 98% 97%   Weight change:  No intake or output data in the 24 hours ending 01/06/15 1034  General: resting in bed HEENT: no scleral icterus Cardiac: RRR, no rubs, murmurs or gallops Pulm: some scattered ronchi, no appreciated wheezing Abd: soft, nontender, nondistended, BS present Ext: warm and well perfused, no pedal edema Neuro: alert and oriented X3  Lab Results: Basic Metabolic Panel:  Recent Labs Lab 01/05/15 0839 01/05/15 1845 01/06/15 0555  NA 135  --  137  K 3.6  --  3.9  CL 100*  --  103  CO2 23  --  24  GLUCOSE 101*  --  121*  BUN 15  --  16  CREATININE 0.81 0.92 0.81  CALCIUM 9.0  --  9.6   CBC:  Recent Labs Lab 01/05/15 0839 01/05/15 1845  WBC 7.9 10.4  NEUTROABS 5.5  --   HGB 14.2 14.3  HCT 43.3 42.7  MCV 89.1 88.8  PLT 199 180   D-Dimer:  Recent Labs Lab 01/05/15 0839  DDIMER 0.53*   Urine Drug Screen: Drugs of Abuse     Component Value Date/Time   LABOPIA NONE DETECTED 12/30/2014 1155   COCAINSCRNUR NONE DETECTED 12/30/2014 1155   LABBENZ POSITIVE* 12/30/2014 1155   AMPHETMU NONE DETECTED 12/30/2014 1155   THCU NONE DETECTED 12/30/2014 1155   LABBARB POSITIVE* 12/30/2014 1155    Alcohol Level:  Recent Labs Lab 12/30/14 1147  ETH <5   Urinalysis: No results for input(s): COLORURINE, LABSPEC, PHURINE, GLUCOSEU, HGBUR, BILIRUBINUR, KETONESUR, PROTEINUR, UROBILINOGEN, NITRITE, LEUKOCYTESUR in the last 168 hours.  Invalid input(s): APPERANCEUR  Micro Results: No results found for this or any previous visit (from the past 240  hour(s)). Studies/Results: Dg Chest 2 View  01/05/2015   CLINICAL DATA:  59 year old female with cough and shortness of Breath. Previous pneumonia. Initial encounter.  EXAM: CHEST  2 VIEW  COMPARISON:  None.  FINDINGS: Large lung volumes with increased AP dimension to the chest. No pneumothorax, pulmonary edema, pleural effusion or consolidation. Curvilinear and streaky opacity in the lingula. Normal cardiac size and mediastinal contours. Calcified atherosclerosis of the aorta. Visualized tracheal air column is within normal limits. No acute osseous abnormality identified.  IMPRESSION: Hyperinflation. Streaky opacity in the lingula without prior exam could reflect acute infectious exacerbation or chronic scarring. No pleural effusion.   Electronically Signed   By: Odessa FlemingH  Hall M.D.   On: 01/05/2015 07:13   Medications: I have reviewed the patient's current medications. Scheduled Meds: . antiseptic oral rinse  7 mL Mouth Rinse BID  . enoxaparin (LOVENOX) injection  30 mg Subcutaneous Q24H  . ipratropium-albuterol  3 mL Nebulization Q4H  . levofloxacin  750 mg Oral Q24H  . methadone  70 mg Oral Daily  . predniSONE  40 mg Oral Q breakfast   Continuous Infusions:  PRN Meds:.hydrOXYzine, zolpidem Assessment/Plan:    COPD exacerbation with possible CAP (community acquired pneumonia) - Much improved today with duonebs, steroids,  and Levaquin - Will have patient ambulate with nursing if tolerates without desaturation can be discharged today otherwise can dc tomorrow. - Will plan to complete 5 days of steroids and 5 days of Abx.    Anxiety - Continue hydroxyzine     Methadone dependence - Continue home methadone, will need to follow up with methadone clinic.   Insomnia - Continue ambien  Dispo: Disposition is deferred at this time, awaiting improvement of current medical problems.  Possible discharge today  The patient does have a current PCP (Pcp Not In System) and [facility]  [facility] appointment after discharge.  The patient does not have transportation limitations that hinder transportation to clinic appointments.  .Services Needed at time of discharge: Y = Yes, Blank = No PT:   OT:   RN:   Equipment:   Other:     LOS: 1 day   Gust RungErik C Marqus Macphee, DO 01/06/2015, 10:34 AM

## 2015-01-06 NOTE — Progress Notes (Signed)
Subjective: No acute events overnight. Patient reports improved breathing and decreased congestion. States that she was unable to sleep even with the Ambien. Endorses continued chronic back and neck pain, attributing it to decreased methadone dose. Patient reports that she may be able to return to [place] within the week. Patient needs temporary shelter in the interim.  Objective: Vital signs in last 24 hours: Filed Vitals:   01/05/15 2134 01/06/15 0340 01/06/15 0535 01/06/15 0842  BP: 143/62  165/64   Pulse: 72  72 72  Temp: 98.2 F (36.8 C)  98.4 F (36.9 C)   TempSrc: Oral  Oral   Resp: 18  18 18   Height:      Weight:      SpO2: 93% 93% 98% 97%    Physical Exam: General appearance: Alert, cooperative, intermittently tearful, intermittently coughing, appears older than stated age, NAD Head: Normocephalic, without obvious abnormality, atraumatic Eyes: Conjunctivae/corneas clear. PERRL, EOM's intact Back: Symmetric, no curvature. ROM normal Lungs: Diffuse rhonchi, scattered wheezes, nl WOB Heart: Regular rate and rhythm, S1, S2 normal, no murmur, click, rub or gallop Abdomen: Soft, non-tender; bowel sounds normal; no masses Skin: Scattered bruising along RLE Neurologic: Grossly normal  Lab Results: Basic Metabolic Panel:  Recent Labs Lab 01/05/15 0839 01/05/15 1845 01/06/15 0555  NA 135  --  137  K 3.6  --  3.9  CL 100*  --  103  CO2 23  --  24  GLUCOSE 101*  --  121*  BUN 15  --  16  CREATININE 0.81 0.92 0.81  CALCIUM 9.0  --  9.6   CBC:  Recent Labs Lab 01/05/15 0839 01/05/15 1845  WBC 7.9 10.4  NEUTROABS 5.5  --   HGB 14.2 14.3  HCT 43.3 42.7  MCV 89.1 88.8  PLT 199 180   Micro Results: No results found for this or any previous visit (from the past 240 hour(s)).  Studies/Results: No new studies.  Medications: I have reviewed the patient's current medications. Scheduled Meds: . antiseptic oral rinse  7 mL Mouth Rinse BID  . enoxaparin  (LOVENOX) injection  30 mg Subcutaneous Q24H  . ipratropium-albuterol  3 mL Nebulization Q4H  . levofloxacin  750 mg Oral Q24H  . methadone  70 mg Oral Daily  . predniSONE  40 mg Oral Q breakfast   Continuous Infusions:  PRN Meds:.hydrOXYzine, zolpidem   Assessment/Plan: Principal Problem:   COPD exacerbation Active Problems:   Polysubstance abuse   CAP (community acquired pneumonia)   Anxiety   Methadone dependence  Annette HaringStacey Montgomery is a 59 year old female who presents with acute COPD exacerbation and possible pneumonia.  Acute COPD exacerbation +/- CAP: Oxygen status improved this morning. Saturations in upper 90s on [place] and room air. Patient presented with hypoxia with base oxygen saturation of 86% on room air. Patient reported SOB, productive cough, neck and back pain, and congestion. Chest x-ray revealed a streaky opacity in the lingula that could refelct acute infection or chronic scarring. Patient has had two prior episodes of pneumonia, once with sepsis, in the previous three years. Patient remains afebrile. Labs are unremarkable.  HIV antibody and urine strep pneumo negative. Patient received Solumedrol x1 in ED. - Continue 40mg  prednisone x3 days (4 days total) - Duonebs - Reevaluate oxygen saturation off nasal cannula - Continue Levaquin x4 days (5 days total) - f/u sputum culture, urine Legionella  Anxiety/PTSD: Per patient report, patient takes klonopin 0.5mg  BID. Patient has not taken a benzo in  one week. Per Narcotics Database search, it appears she filled a #30 of 1mg  klonopin 2/27 [facility], then [facility], then a [facility], and a #30 of Temazepam 30mg  from Len BlalockNykendrtra Brown NP (who appears to work in [place]).  - Home home klonopin - Hydroxyzine for anxiety  Methadone dependence: Patient states she is on a 120mg  methadone regimen in [place] and has been on methadone for the last 20 years.  Patient's dose was decreased from 120mg  to 70mg  at methadone clinic in [place] due to UDS positive for benzos. Recent BP elevation possibly secondary to benzo withdrawal. - Continue Methadone 70mg  daily - Monitor for benzo withdrawal - Monitor BP  Polysubstance Abuse: UDS positive for benzodiazepines and barbiturates on 5/5. - Continue Methadone 70mg  daily  Insomnia - Ambien PRN  FEN/GI: Diet: Regular DVT PPx: Lovenox Code: Full  Dispo: Disposition is deferred at this time, awaiting improvement of current medical problems. Anticipated discharge in approximately 1 day(s). Patient may follow up with [facility] wellness  The patient does have a current PCP (Pcp Not In System) and does not [facility] after discharge.  The patient does not have transportation limitations that hinder transportation to clinic appointments.  This is a Psychologist, occupationalMedical Student Note.  The care of the patient was discussed with Dr. Harmon DunEric Hoffman and the assessment and plan formulated with their assistance.  Please see their attached note for official documentation of the daily encounter.   LOS: 1 day   Annette SpiesKhadijah Korayma Montgomery, Med Student 01/06/2015, 9:43 AM

## 2015-01-06 NOTE — Progress Notes (Signed)
SATURATION QUALIFICATIONS: (This note is used to comply with regulatory documentation for home oxygen)  Patient Saturations on Room Air at Rest = 90%  Patient Saturations on Room Air while Ambulating = 87%  Patient Saturations on 2 Liters of oxygen while Ambulating = 92%  Please briefly explain why patient needs home oxygen: COPD exaccerb

## 2015-01-06 NOTE — Progress Notes (Addendum)
Offered patient pamphlet for [facility] as no PCP is listed in computer. Pt states that she is from [place] and not in need of a local PCP. She stated that she is going back to [place] immediately after discharge. Pt declined any other discharge needs.

## 2015-01-06 NOTE — Progress Notes (Signed)
  Date: 01/06/2015  Patient name: Annette SamStacey Ann Beg  Medical record number: [id number]  Date of birth: 06/16/1956   I have seen and evaluated Annette Montgomery and discussed their care with the Residency Team. Annette Montgomery is a pleasant 59 yo female admitted for dyspnea and found to have a COPD exac and likely lingular infiltrate.   Filed Vitals:   01/06/15 1209  BP:   Pulse: 87  Temp:   Resp: 18  T 98.4, BP 165/64. O2 sat 90% rest RA. 87% with ambulation  Gen : thin NAD. Speaking in full sentences. HRRR  L good air flow, freq cough, coarse BS B  Assessment and Plan: I have seen and evaluated the patient as outlined above. I agree with the formulated Assessment and Plan as detailed in the residents' admission note, with the following changes:   1. Acute hypoxic resp failure 2/2 COPD exac 2/2 CAP - She is responding well with ABX, nebs, steroids, and O2. She would be able to be D/C'd but has ongoing O2 need (desat with ambulation) and is homeless so her D/C today would not be safe. Will cont tx and recheck O2 in AM.  2. Opioid addiction - is seen and tx by a methadone clinic. We are cont her methadone dose in the hospital but will not be able to provide her with any methadone at D/C.   3. Anxiety - agree with no benzo and cont hydroxyzine  4. Homelessness - plans to return to [place] ASAP as she has housing there.  Burns SpainElizabeth A Shamonique Battiste, MD 5/12/20162:27 PM

## 2015-01-06 NOTE — Care Management Note (Signed)
Case Management Note  Patient Details  Name: Alberteen SamStacey Ann Nidiffer MRN: [id number] Date of Birth: 06/16/1956  Subjective/Objective:                 Pt does not have home currently, came from [place] with friend. Given resources on housing by SW. Declined further assistance. Stated she planned on returning to [place] immediately at discharge and [facility]. Pt does state she has Medicaid.   Action/Plan:  Offered additional services and Pt declined. Expected Discharge Date:                  Expected Discharge Plan:  Home/Self Care  In-House Referral:  Clinical Social Work  Discharge planning Services  CM Consult  Post Acute Care Choice:    Choice offered to:     DME Arranged:    DME Agency:     HH Arranged:    HH Agency:     Status of Service:  Completed, signed off  Medicare Important Message Given:  No Date Medicare IM Given:    Medicare IM give by:    Date Additional Medicare IM Given:    Additional Medicare Important Message give by:     If discussed at Long Length of Stay Meetings, dates discussed:    Additional Comments:  Lawerance SabalDebbie Brithany Whitworth, RN 01/06/2015, 12:04 PM

## 2015-01-07 DIAGNOSIS — Z590 Homelessness: Secondary | ICD-10-CM | POA: Diagnosis not present

## 2015-01-07 DIAGNOSIS — R030 Elevated blood-pressure reading, without diagnosis of hypertension: Secondary | ICD-10-CM | POA: Diagnosis not present

## 2015-01-07 DIAGNOSIS — J441 Chronic obstructive pulmonary disease with (acute) exacerbation: Secondary | ICD-10-CM | POA: Diagnosis not present

## 2015-01-07 LAB — LEGIONELLA ANTIGEN, URINE

## 2015-01-07 MED ORDER — ALBUTEROL SULFATE (2.5 MG/3ML) 0.083% IN NEBU: 2.5000 mg | INHALATION_SOLUTION | RESPIRATORY_TRACT | Status: DC | PRN

## 2015-01-07 MED ORDER — DM-GUAIFENESIN ER 30-600 MG PO TB12
1.0000 | ORAL_TABLET | Freq: Two times a day (BID) | ORAL | Status: DC
  Administered 2015-01-07: 1 via ORAL
  Filled 2015-01-07 (×2): qty 1

## 2015-01-07 MED ORDER — ENOXAPARIN SODIUM 40 MG/0.4ML ~~LOC~~ SOLN
40.0000 mg | SUBCUTANEOUS | Status: DC
  Filled 2015-01-07: qty 0.4

## 2015-01-07 MED ORDER — ZOLPIDEM TARTRATE 5 MG PO TABS: 10.0000 mg | ORAL_TABLET | Freq: Every evening | ORAL | Status: DC | PRN

## 2015-01-07 MED ORDER — ALBUTEROL SULFATE HFA 108 (90 BASE) MCG/ACT IN AERS: 2.0000 | INHALATION_SPRAY | Freq: Four times a day (QID) | RESPIRATORY_TRACT | Status: DC | PRN

## 2015-01-07 MED ORDER — DM-GUAIFENESIN ER 30-600 MG PO TB12
1.0000 | ORAL_TABLET | Freq: Two times a day (BID) | ORAL | Status: DC
Start: 2015-01-07 — End: 2015-01-19

## 2015-01-07 MED ORDER — LEVOFLOXACIN 750 MG PO TABS: 750.0000 mg | ORAL_TABLET | ORAL | Status: AC

## 2015-01-07 MED ORDER — ALBUTEROL SULFATE HFA 108 (90 BASE) MCG/ACT IN AERS: 2.0000 | INHALATION_SPRAY | RESPIRATORY_TRACT | Status: DC | PRN

## 2015-01-07 MED ORDER — PREDNISONE 20 MG PO TABS: 40.0000 mg | ORAL_TABLET | Freq: Every day | ORAL | Status: AC

## 2015-01-07 NOTE — Progress Notes (Signed)
PT Cancellation Note and Discharge  Patient Details Name: Alberteen SamStacey Ann Barthelemy MRN: [id number] DOB: 01/02/1956   Cancelled Treatment:    Reason Eval/Treat Not Completed: PT screened, no needs identified, will sign off. Observed pt ambulating independently around unit. Discussed with RN who states pt has been independent and is currently discharged. If needs change please reconsult.    Conni SlipperKirkman, Alexsandra Shontz 01/07/2015, 2:17 PM   Conni SlipperLaura Theo Reither, PT, DPT Acute Rehabilitation Services Pager: [phone]

## 2015-01-07 NOTE — Progress Notes (Signed)
Subjective: No acute events overnight. Patient's breathing has improved. During rounds, patient is tearful and reports that she "just had a panic attack" over her homelessness in [place] and situation upon discharge. Patient states that she cannot afford her medications and that she needs at a least a bus pass and shelter, otherwise she'll be left on the streets. Patient would be amenable to SNF placement.  Objective: Vital signs in last 24 hours: Filed Vitals:   01/06/15 2131 01/06/15 2134 01/07/15 0555 01/07/15 0822  BP: 146/64 132/59 157/75   Pulse: 78  65   Temp: 99.1 F (37.3 C)  98.5 F (36.9 C)   TempSrc: Oral  Oral   Resp: 18  20   Height:      Weight:      SpO2: 95%  100% 92%   Weight change:   Intake/Output Summary (Last 24 hours) at 01/07/15 0829 Last data filed at 01/06/15 1145  Gross per 24 hour  Intake    120 ml  Output    450 ml  Net   -330 ml   Physical Exam: General appearance: Alert, cooperative, intermittently tearful, intermittently coughing, appears older than stated age, NAD Head: Normocephalic, without obvious abnormality, atraumatic Eyes: Conjunctivae/corneas clear. PERRL, EOM's intact Back: Symmetric, no curvature. ROM normal Lungs: Diffuse rhonchi, scattered wheezes, nl WOB Heart: Regular rate and rhythm, S1, S2 normal, no murmur, click, rub or gallop Abdomen: Soft, non-tender; bowel sounds normal; no masses Skin: Scattered bruising along RLE Neurologic: Grossly normal  Lab Results: No new labs.  Micro Results: No results found for this or any previous visit (from the past 240 hour(s)).   Studies/Results: No results found.   Medications: I have reviewed the patient's current medications. Scheduled Meds: . antiseptic oral rinse  7 mL Mouth Rinse BID  . enoxaparin (LOVENOX) injection  40 mg Subcutaneous Q24H  . ipratropium-albuterol  3 mL Nebulization Q6H  . levofloxacin  750 mg Oral Q24H  . methadone  70 mg Oral Daily  . predniSONE  40 mg  Oral Q breakfast   Continuous Infusions:  PRN Meds:.hydrOXYzine, zolpidem   Assessment/Plan: Principal Problem:   COPD exacerbation Active Problems:   Polysubstance abuse   CAP (community acquired pneumonia)   Anxiety   Methadone dependence   Annette Montgomery is a 59 year old female who presents with acute COPD exacerbation and possible pneumonia. Will consult social work and case management to help with medications and placement upon discharge. Likely discharge today.  Acute COPD exacerbation +/- CAP: Saturations in upper 90s on [place] and 92% on room air this morning. Patient presented with hypoxia with base oxygen saturation of 86% on room air. Patient reported SOB, productive cough, neck and back pain, and congestion. Chest x-ray revealed a streaky opacity in the lingula that could refelct acute infection or chronic scarring. Patient has had two prior episodes of pneumonia, once with sepsis, in the previous three years. Patient remains afebrile. Labs are unremarkable. HIV antibody and urine strep pneumo negative. Patient received Solumedrol x1 in ED. - Continue 40mg  prednisone x2 days (4 days total) - Duonebs - Reevaluate oxygen saturation off nasal cannula - Continue Levaquin x3 days (5 days total) - f/u sputum culture, urine Legionella  Anxiety/PTSD: Per patient report, patient takes klonopin 0.5mg  BID. Patient has not taken a benzo in one week. Per Narcotics Database search, it appears she filled a #30 of 1mg  klonopin 2/27 [facility], then a #7 of Temazepam [facility]mg  [facility]  on 3/22, then a #7 of Temazepam 30mg  [facility] on 4/11, and a #30 of Temazepam 30mg  from Len BlalockNykendrtra Brown NP (who appears to work in [place]).  - Hold home klonopin - Hydroxyzine for anxiety  Methadone dependence: Patient states she is on a 120mg  methadone regimen in [place] and has been on methadone for the last 20 years. Patient's dose was decreased from 120mg  to 70mg  at methadone clinic in [place]  due to UDS positive for benzos. Recent BP elevation possibly secondary to benzo withdrawal. - Continue Methadone 70mg  daily - Monitor for benzo withdrawal - Monitor BP  Polysubstance Abuse: UDS positive for benzodiazepines and barbiturates on 5/5. - Continue Methadone 70mg  daily  Insomnia - Ambien PRN  FEN/GI: Diet: Regular DVT PPx: Lovenox Code: Full  Dispo: Disposition is deferred at this time, awaiting improvement of current medical problems. Anticipated discharge today. Patient may follow up with Community health and wellness  The patient does have a current PCP (Pcp Not In System) and does not need an [facility] follow-up appointment after discharge.  The patient does not have transportation limitations that hinder transportation to clinic appointments.  This is a Psychologist, occupationalMedical Student Note.  The care of the patient was discussed with Dr. Blanch MediaElizabeth Butcher and the assessment and plan formulated with their assistance.  Please see their attached note for official documentation of the daily encounter.   LOS: 2 days   Annette Montgomery, Med Student 01/07/2015, 8:29 AM

## 2015-01-07 NOTE — Progress Notes (Signed)
Subjective: Reports she is continuing to feel better, she notes that she is now at 100% Oxygen and she can finally think clearly.  She reports that she wants a note for her methadone clinic that she cannot think clearly when her Oxygen drops below 100%. Objective: Vital signs in last 24 hours: Filed Vitals:   01/06/15 2103 01/06/15 2131 01/06/15 2134 01/07/15 0555  BP:  146/64 132/59 157/75  Pulse:  78  65  Temp:  99.1 F (37.3 C)  98.5 F (36.9 C)  TempSrc:  Oral  Oral  Resp:  18  20  Height:      Weight:      SpO2: 94% 95%  100%   Weight change:   Intake/Output Summary (Last 24 hours) at 01/07/15 0710 Last data filed at 01/06/15 1145  Gross per 24 hour  Intake    120 ml  Output    450 ml  Net   -330 ml    General: resting in bed HEENT: no scleral icterus Cardiac: RRR, no rubs, murmurs or gallops Pulm: scattered ronchi, no appreciated wheezing Abd: soft, nontender, nondistended, BS present Ext: warm and well perfused, no pedal edema Neuro: alert and oriented X3  Lab Results: Basic Metabolic Panel:  Recent Labs Lab 01/05/15 0839 01/05/15 1845 01/06/15 0555  NA 135  --  137  K 3.6  --  3.9  CL 100*  --  103  CO2 23  --  24  GLUCOSE 101*  --  121*  BUN 15  --  16  CREATININE 0.81 0.92 0.81  CALCIUM 9.0  --  9.6   CBC:  Recent Labs Lab 01/05/15 0839 01/05/15 1845  WBC 7.9 10.4  NEUTROABS 5.5  --   HGB 14.2 14.3  HCT 43.3 42.7  MCV 89.1 88.8  PLT 199 180   D-Dimer:  Recent Labs Lab 01/05/15 0839  DDIMER 0.53*   Urine Drug Screen: Drugs of Abuse     Component Value Date/Time   LABOPIA NONE DETECTED 12/30/2014 1155   COCAINSCRNUR NONE DETECTED 12/30/2014 1155   LABBENZ POSITIVE* 12/30/2014 1155   AMPHETMU NONE DETECTED 12/30/2014 1155   THCU NONE DETECTED 12/30/2014 1155   LABBARB POSITIVE* 12/30/2014 1155    Alcohol Level: No results for input(s): ETH in the last 168 hours. Urinalysis: No results for input(s): COLORURINE, LABSPEC,  PHURINE, GLUCOSEU, HGBUR, BILIRUBINUR, KETONESUR, PROTEINUR, UROBILINOGEN, NITRITE, LEUKOCYTESUR in the last 168 hours.  Invalid input(s): APPERANCEUR  Micro Results: No results found for this or any previous visit (from the past 240 hour(s)). Studies/Results: No results found. Medications: I have reviewed the patient's current medications. Scheduled Meds: . antiseptic oral rinse  7 mL Mouth Rinse BID  . enoxaparin (LOVENOX) injection  40 mg Subcutaneous Q24H  . ipratropium-albuterol  3 mL Nebulization Q6H  . levofloxacin  750 mg Oral Q24H  . methadone  70 mg Oral Daily  . predniSONE  40 mg Oral Q breakfast   Continuous Infusions:  PRN Meds:.hydrOXYzine, zolpidem Assessment/Plan:    COPD exacerbation with possible CAP (community acquired pneumonia) - Continued improvement  with duonebs, steroids, and Levaquin - Will have patient ambulate with nursing if tolerates without desaturation can be discharged today. - Will plan to complete 5 days of steroids and 5 days of Abx. - Instructed patient I feel that it is safe for her to have O2 sat >89% when she ambulates - Will consult PT to evaluate if patient would meet criteria for SNF placement. - Will  follow up with social work and care management about homelessness issues and affording meds.  Main medication she will need is 2 doses of prednisone 40mg  and 2 doses of levaquin 750mg .    Anxiety - Continue hydroxyzine     Methadone dependence - Continue home methadone, will need to follow up with methadone clinic.   Insomnia - Reports continued difficulty pt wants increase, will increase to 10mg  of Ambien  Dispo: Disposition is deferred at this time, awaiting improvement of current medical problems.  Possible discharge today  The patient does have a current PCP (Pcp Not In System) and does not need an [facility] follow-up appointment after discharge.  The patient does not have transportation limitations that hinder transportation to  clinic appointments.  .Services Needed at time of discharge: Y = Yes, Blank = No PT:   OT:   RN:   Equipment:   Other:     LOS: 2 days   Gust RungErik C Hoffman, DO 01/07/2015, 7:10 AM

## 2015-01-07 NOTE — Progress Notes (Signed)
Patient discharge teaching given, including activity, diet, follow-up appoints, and medications. Patient verbalized understanding of all discharge instructions. IV access was d/c'd. Vitals are stable. Skin is intact except as charted in most recent assessments. Pt did NOT want to wait for bus pass from social work, or a printed prescription for albuterol inhaler ([facility]). Pt escorted out by NT.

## 2015-01-07 NOTE — Progress Notes (Signed)
Received communication from Carles Collet, RN Care Manager that patient needing follow-up at [facility] after discharge.  Met with Carles Collet to determine patient's needs. Patient currently hospitalized with COPD exacerbation.  Patient from [place]; she is currently homeless and possibly needing home oxygen after discharge.  Discussed resources such as the [employer] as well as [employer] for shelter after discharge.   Patient needing hospital follow-up appointment after discharge.  Appointment obtained on 01/12/15 at 1200 with Dr. Jarold Song at [facility].  Vesta Mixer, SW at [facility] also updated about patient so she can follow-up with patient at her appointment.  Also discussed patient able to obtain needed medications at [facility] after discharge. Carles Collet, Consulting civil engineer appreciative of assistance and indicates she will collaborate with inpatient SW regarding connecting patient with [employer].

## 2015-01-07 NOTE — Discharge Instructions (Signed)
Please take Prednisone 40mg  once a day for 2 more days (starting Saturday) as well as Levofloxacin 750mg  once a day for 2 more days (also starting Saturday). [facility] and [facility] is giving [facility] one please have it with you and use it as needed.  When you get back to [place] you need to see your primary care physician. If you remain in [place] you NEED to show up for an appointment at the clinic on the 18th, if you do not you will not be able to get another appointment.

## 2015-01-07 NOTE — Progress Notes (Signed)
Social work and MD called minutes after pt left. Bus passes tubed back to SW. [facility] to [facility] so it is there for her when she goes to get her other meds.

## 2015-01-07 NOTE — Progress Notes (Addendum)
Spoke with [name] about needs after discharge. She was much more talkative this morning. She offered more information about her needs. She stated that she prefers to return to [place] as soon as possible.  She stated if her money is available to her Monday she will leave then; if it is not she wil be leaving for [place] on June 1st. I  encouraged her to work with CM and SW team to help her come up with a plan to meet her needs assuming she would stay until June 1st so she would be safe until then.  She states her barriers as: 1. Not having a home. She states there [place]ens shelters [place] that [place]. (Pt advised [facility] is her only option on 01-05-15) 2. Not having transportation. (Pt does have medicaid that can transport to appointments) 3. Not having money to afford medications. ([facility], [facility] resources provided in AVS) 4. Portable home Oxygen if needed at discharge. (Would have to use Shelter as address) CM [facility] with [facility], secured appointment for 01-12-15, pt has no showed previously and this will be her last chance to establish care there. [facility] information provided on AVS. FMTS suggesting SNF for DC due to pt's deconditioned state, weakness, and oxygen demand. [place] SW updated. SW also spoke with FMTS, will work on SNF but could not guarantee securing a bed. Dispo will be SNF or Homeless Shelter.  Will continue to follow.   01-07-15 14:20 Pt will not qualify for SNF, [facility]. Pt will be provided [facility]. [facility] with Rx in hand to be filled, verified medications are available with pharmacy prior to discharge. Pt does not need home oxygen.

## 2015-01-07 NOTE — Progress Notes (Signed)
Pt maintained 95% O2 sats on room air while ambulating.

## 2015-01-12 ENCOUNTER — Encounter: Payer: Self-pay | Admitting: Family Medicine

## 2015-01-12 ENCOUNTER — Ambulatory Visit: Payer: Medicaid Other | Attending: Family Medicine | Admitting: Family Medicine

## 2015-01-12 VITALS — BP 156/90 | HR 69 | Temp 98.3°F | Resp 18 | Ht 67.0 in | Wt 121.0 lb

## 2015-01-12 DIAGNOSIS — R030 Elevated blood-pressure reading, without diagnosis of hypertension: Secondary | ICD-10-CM | POA: Diagnosis not present

## 2015-01-12 DIAGNOSIS — J189 Pneumonia, unspecified organism: Secondary | ICD-10-CM

## 2015-01-12 DIAGNOSIS — J441 Chronic obstructive pulmonary disease with (acute) exacerbation: Secondary | ICD-10-CM

## 2015-01-12 DIAGNOSIS — Z720 Tobacco use: Secondary | ICD-10-CM | POA: Diagnosis not present

## 2015-01-12 DIAGNOSIS — F1910 Other psychoactive substance abuse, uncomplicated: Secondary | ICD-10-CM | POA: Diagnosis not present

## 2015-01-12 DIAGNOSIS — F1120 Opioid dependence, uncomplicated: Secondary | ICD-10-CM | POA: Diagnosis not present

## 2015-01-12 DIAGNOSIS — IMO0001 Reserved for inherently not codable concepts without codable children: Secondary | ICD-10-CM

## 2015-01-12 MED ORDER — TRAZODONE HCL 50 MG PO TABS: 50.0000 mg | ORAL_TABLET | Freq: Every evening | ORAL | Status: DC | PRN

## 2015-01-12 MED ORDER — FLUTICASONE-SALMETEROL 250-50 MCG/DOSE IN AEPB: 1.0000 | INHALATION_SPRAY | Freq: Two times a day (BID) | RESPIRATORY_TRACT | Status: DC
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
## 2015-01-12 NOTE — Progress Notes (Signed)
Patient went to ED, hospitalized for severe allergic reaction to climate here. Patient from [place]. Patient has lost family, husband and 3 friends in last few years. Patient reports generalized ache, at level 4. Patients breathing is better. Patient is living in shelter. Patient reports she has not slept in 3 weeks. Patient on Methadone.

## 2015-01-12 NOTE — Patient Instructions (Signed)

## 2015-01-19 ENCOUNTER — Encounter: Payer: Self-pay | Admitting: Family Medicine

## 2015-01-19 ENCOUNTER — Ambulatory Visit: Payer: Medicaid Other | Attending: Family Medicine | Admitting: Family Medicine

## 2015-01-19 VITALS — BP 162/82 | HR 69 | Temp 99.0°F | Resp 16 | Ht 67.0 in | Wt 118.0 lb

## 2015-01-19 DIAGNOSIS — J449 Chronic obstructive pulmonary disease, unspecified: Secondary | ICD-10-CM | POA: Diagnosis not present

## 2015-01-19 DIAGNOSIS — Z9114 Patient's other noncompliance with medication regimen: Secondary | ICD-10-CM | POA: Diagnosis not present

## 2015-01-19 DIAGNOSIS — F419 Anxiety disorder, unspecified: Secondary | ICD-10-CM | POA: Diagnosis not present

## 2015-01-19 DIAGNOSIS — J189 Pneumonia, unspecified organism: Secondary | ICD-10-CM

## 2015-01-19 DIAGNOSIS — G4700 Insomnia, unspecified: Secondary | ICD-10-CM | POA: Diagnosis not present

## 2015-01-19 DIAGNOSIS — R319 Hematuria, unspecified: Secondary | ICD-10-CM | POA: Insufficient documentation

## 2015-01-19 DIAGNOSIS — F17200 Nicotine dependence, unspecified, uncomplicated: Secondary | ICD-10-CM | POA: Insufficient documentation

## 2015-01-19 LAB — POCT URINALYSIS DIPSTICK
Bilirubin, UA: NEGATIVE
Glucose, UA: NEGATIVE
Ketones, UA: NEGATIVE
Nitrite, UA: NEGATIVE
PH UA: 7
PROTEIN UA: NEGATIVE
RBC UA: NEGATIVE
SPEC GRAV UA: 1.025
UROBILINOGEN UA: 1

## 2015-01-19 NOTE — Assessment & Plan Note (Signed)
A: stable no active exacerbation. Patient non compliant with advair and albuterol.  P: advised avoiding smoking

## 2015-01-19 NOTE — Assessment & Plan Note (Signed)
Insomnia and chronic anxiety:  If you have not tried melatonin or tryptophan for insomnia, I recommend them For anxiety: stress relievers like yoga, meditation, or exercise are recommende

## 2015-01-19 NOTE — Progress Notes (Signed)
Establish Care HFU Pt Stated need to talk to the Dr

## 2015-01-19 NOTE — Patient Instructions (Signed)
Ms. Derrill KayGoodman,  Very nice to meet you. I have cleaned up your med list to reflect what you are taking.   1. Blood in urine: urine negative for blood today.  Please drink plenty of water. If bleeding returns please follow up  2. COPD/pneumonia: lungs are clear Avoid smoking  3.  Insomnia and chronic anxiety:  If you have not tried melatonin or tryptophan for insomnia, I recommend them For anxiety: stress relievers like yoga, meditation, or exercise are recommended   F/u as needed   Call or f/u if you desires GI referral for screening colonoscopy Please have pap smear and mammogram results from [place] sent to me at this office if you plan to stay in town/   Dr. Armen PickupFunches

## 2015-01-19 NOTE — Assessment & Plan Note (Addendum)
A: reported painless hematuria, normal UA w/o blood.  P: Reassurance Urine GC/chlam

## 2015-01-19 NOTE — Progress Notes (Signed)
   Subjective:    Patient ID: Annette Montgomery, female    DOB: 06/22/1956, 59 y.o.   MRN: [id number] CC: meet PCP, f/u COPD and recent CAP, hematuria   59 yo F with mood disorder, hematuria, chronic anxiety,  Hematuria This is a new problem. The current episode started in the past 7 days. The problem is unchanged. She describes the hematuria as gross hematuria. Her pain is at a severity of 0/10. She is experiencing no pain. Irritative symptoms do not include frequency. Pertinent negatives include no chills, dysuria, fever or flank pain.   COPD: not using advair or albuterol. Smoking intermittently. Admits to congestion. not taking mucinex as she reports it does not help. Believes that the environment is triggering her symptoms (humid/wet climates) planning to move back to [place] or to [place].   Health care maintenance: patient reports having a normal mammogram and pap smear 3 months ago in [place]. She declines retesting. Has not had colonoscopy.   Soc Hx: smoker  Review of Systems  Constitutional: Negative for fever and chills.  HENT: Positive for congestion.   Respiratory: Negative for cough, shortness of breath and wheezing.   Genitourinary: Positive for hematuria. Negative for dysuria, frequency and flank pain.  Psychiatric/Behavioral: Positive for sleep disturbance. The patient is nervous/anxious.        Not taking prescribed trazodone or atarax as she states they do not help       Objective:   Physical Exam BP 162/82 mmHg  Pulse 69  Temp(Src) 99 F (37.2 C) (Oral)  Resp 16  Ht 5\' 7"  (1.702 m)  Wt 118 lb (53.524 kg)  BMI 18.48 kg/m2  SpO2 99% General appearance: alert, cooperative and no distress Back: symmetric, no curvature. ROM normal. No CVA tenderness. Lungs: clear to auscultation bilaterally Heart: regular rate and rhythm, S1, S2 normal, no murmur, click, rub or gallop   UA: yellow/clear. Negative for blood. Small leukocytes.  Urine GC/cham: pending     Assessment & Plan:

## 2015-01-19 NOTE — Assessment & Plan Note (Signed)
COPD/pneumonia: lungs are clear Avoid smoking

## 2015-01-20 LAB — GC/CHLAMYDIA PROBE AMP, URINE
Chlamydia, Swab/Urine, PCR: NEGATIVE
GC Probe Amp, Urine: NEGATIVE

## 2015-02-07 ENCOUNTER — Telehealth: Payer: Self-pay | Admitting: *Deleted

## 2015-02-07 NOTE — Telephone Encounter (Signed)
LVM to return call.

## 2015-02-07 NOTE — Telephone Encounter (Signed)
-----   Message from Doris Cheadle, MD sent at 01/20/2015 11:11 AM EDT ----- Call and let the patient know that her test for gonorrhea and chlamydia is negative

## 2015-02-10 NOTE — Telephone Encounter (Signed)
Pt returning call, please f/u with pt.  °

## 2015-02-15 NOTE — Telephone Encounter (Signed)
Pt aware of results 

## 2015-02-21 ENCOUNTER — Ambulatory Visit: Payer: Medicaid Other | Attending: Family Medicine | Admitting: Family Medicine

## 2015-02-21 ENCOUNTER — Encounter: Payer: Self-pay | Admitting: Family Medicine

## 2015-02-21 DIAGNOSIS — F419 Anxiety disorder, unspecified: Secondary | ICD-10-CM | POA: Diagnosis not present

## 2015-02-21 MED ORDER — GABAPENTIN 300 MG PO CAPS: 600.0000 mg | ORAL_CAPSULE | Freq: Every day | ORAL | Status: DC

## 2015-02-21 MED ORDER — GABAPENTIN 300 MG PO CAPS: 300.0000 mg | ORAL_CAPSULE | Freq: Three times a day (TID) | ORAL | Status: DC

## 2015-02-21 NOTE — Progress Notes (Signed)
ASSESSMENT: Pt currently experiencing symptoms of anxiety. Pt needs to f/u with PCP, and take [facility] meds as prescribed. Pt would benefit from psychiatry for med management, as well as psychoeducation and supportive counseling regarding coping with symptoms of anxiety.  Stage of Change: action  PLAN: 1. F/U with behavioral health consultant in as needed 2. Psychiatric Medications: Gabapentin. 3. Behavioral recommendation(s):   -Consider [facility] for continued med management -Consider reading over educational materials regarding coping with symptoms of anxiety.   SUBJECTIVE: Pt. referred by Dr Armen PickupFunches for anxiety:  Pt. here for referral regarding anxiety.  Pt. reports the following symptoms/concerns: Pt states that she has had anxiety for many years, and has tried numerous medications, as well as alternative treatments for alleviating symptoms of anxiety, and that Zyprexa worked the best. Pt says that she has had a lifetime of counseling, and knows herself well enough to know what works for her, and that she just wants to go through a psychiatrist who can prescribe medication.  Duration of problem: <40 years Severity: moderate  OBJECTIVE: Orientation & Cognition: Oriented x3. Thought processes normal and appropriate to situation. Mood: appropriate. Affect: appropriate Appearance: appropriate Risk of harm to self or others: no risk of harm to self or others Substance use: polysubstance Psychiatric medication use: Unchanged from prior contact. Assessments administered: PSQ9-11/ GAD7-15  Diagnosis: Anxiety CPT Code: F41.9 -------------------------------------------- Other(s) present in the room: none  Time spent with patient in exam room: 20 minutes

## 2015-02-21 NOTE — Assessment & Plan Note (Signed)
Anxiety:  Add gabapentin 300 mg at night for 3 days, then 600 mg at night for 3 days  Checking vitamin D level   Counseling and psychiatric services available at [facility], [facility] and [facility].

## 2015-02-21 NOTE — Progress Notes (Signed)
   Subjective:    Patient ID: Annette Montgomery, female    DOB: 10/21/1955, 59 y.o.   MRN: [id number] CC: anxiety  HPI 59 yo F with chronic anxiety, hx of substance abuse on methadone managed at pain management   1. Anxiety: chronic for many years. Most recently managed with nightly tamazepam. Also has taking risperidone in the past. Cannot take Bz now due to her pain contract. She is interested in referral to mental health and restarting gabapentin. She has trouble sleeping. Most recent tox screen was positive for BZ and barbituates.    Review of Systems  Constitutional: Negative for fever and chills.  Respiratory: Negative for shortness of breath.   Cardiovascular: Negative for chest pain.  Gastrointestinal: Negative for abdominal pain and blood in stool.  Skin: Negative for rash.  Psychiatric/Behavioral: Positive for dysphoric mood. Negative for suicidal ideas. The patient is nervous/anxious.    GAD-7: 15 Vertigo Insomnia Anxiety     Objective:   Physical Exam BP 147/79 mmHg  Pulse 61  Temp(Src) 99.4 F (37.4 C) (Oral)  Resp 16  Ht 5\' 7"  (1.702 m)  Wt 115 lb (52.164 kg)  BMI 18.01 kg/m2  SpO2 100% General appearance: alert, cooperative and no distress  Lungs: clear to auscultation bilaterally Heart: regular rate and rhythm, S1, S2 normal, no murmur, click, rub or gallop     Assessment & Plan:

## 2015-02-21 NOTE — Patient Instructions (Addendum)
Ms. Annette Montgomery,  Thank you for coming in today  1. Anxiety:  Add gabapentin 300 mg at night for 3 days, then 600 mg at night for 3 days   Counseling and psychiatric services available at [facility], [facility] and [facility].  You can discuss possibly restarting zyprexa with the psychiatrist   F/u with me in 3-4 weeks for pap smear  Dr. Armen PickupFunches

## 2015-02-21 NOTE — Progress Notes (Signed)
Complaining Anxiety, no medication x2 month ago  Unable to sleep

## 2015-04-02 ENCOUNTER — Encounter: Payer: Self-pay | Admitting: *Deleted

## 2015-04-20 ENCOUNTER — Encounter: Payer: Self-pay | Admitting: Pharmacist

## 2015-05-11 ENCOUNTER — Encounter (HOSPITAL_COMMUNITY): Payer: Self-pay | Admitting: Emergency Medicine

## 2015-05-11 ENCOUNTER — Emergency Department (HOSPITAL_COMMUNITY)
Admission: EM | Admit: 2015-05-11 | Discharge: 2015-05-11 | Disposition: A | Discharge status: Home / Self Care | Payer: Medicaid Other | Source: Home / Self Care | Attending: Emergency Medicine | Admitting: Emergency Medicine

## 2015-05-11 DIAGNOSIS — M62830 Muscle spasm of back: Secondary | ICD-10-CM

## 2015-05-11 DIAGNOSIS — R51 Headache: Secondary | ICD-10-CM | POA: Diagnosis not present

## 2015-05-11 DIAGNOSIS — R519 Headache, unspecified: Secondary | ICD-10-CM

## 2015-05-11 MED ORDER — CYCLOBENZAPRINE HCL 5 MG PO TABS: 5.0000 mg | ORAL_TABLET | Freq: Three times a day (TID) | ORAL | Status: DC | PRN

## 2015-05-11 MED ORDER — PREDNISONE 50 MG PO TABS: ORAL_TABLET | ORAL | Status: DC

## 2015-05-11 MED ORDER — BUTALBITAL-APAP-CAFFEINE 50-325-40 MG PO TABS: 1.0000 | ORAL_TABLET | Freq: Four times a day (QID) | ORAL | Status: DC | PRN

## 2015-05-11 NOTE — ED Provider Notes (Signed)
CSN: [id number]     Arrival date & time 05/11/15  1645 History   First MD Initiated Contact with Patient 05/11/15 1712     Chief Complaint  Patient presents with  . Spasms   (Consider location/radiation/quality/duration/timing/severity/associated sxs/prior Treatment) HPI  She is a 59-year-old woman here for evaluation of muscle spasms. She states this is a recurring problem for her. She reports spasm in her upper back, neck, and shoulders. She has been applying moist heat without much improvement. The current episode started about 2 weeks ago and has gradually been getting worse. This last happened in May of this year. She reports developing some headaches in the last 2 days she attributes to the spasms. No radiating pain. No numbness, tingling, weakness in her extremities. No nausea. No vision changes.  Past Medical History  Diagnosis Date  . Anxiety     was managed with BZDs until begining of May  . Insomnia   . Neck injury   . Back pain   . Pulmonary embolism 2015    treated with warfarin for 6 months, reports it was found by accident  . PTSD (post-traumatic stress disorder)     lost brother to ARDS, father to Alzheimers  . Panic attacks   . Polysubstance abuse     reports narcotics 20 years ago, has been on methadone for 20 years, also admits marijuana   . Homelessness   . Pneumonia 2014   History reviewed. No pertinent past surgical history. Family History  Problem Relation Age of Onset  . Cancer Mother   . Dementia Father    Social History  Substance Use Topics  . Smoking status: Light Tobacco Smoker -- 0.00 packs/day for 10 years    Types: Cigarettes  . Smokeless tobacco: Never Used  . Alcohol Use: No   OB History    No data available     Review of Systems As in history of present illness Allergies  Contrast media; Erythromycin; and Nsaids  Home Medications   Prior to Admission medications   Medication Sig Start Date End Date Taking? Authorizing Provider   butalbital-acetaminophen-caffeine (FIORICET) 50-325-40 MG per tablet Take 1-2 tablets by mouth every 6 (six) hours as needed for headache. 05/11/15 05/10/16  Charm Rings, MD  cyclobenzaprine (FLEXERIL) 5 MG tablet Take 1-2 tablets (5-10 mg total) by mouth 3 (three) times daily as needed for muscle spasms. 05/11/15   Charm Rings, MD  gabapentin (NEURONTIN) 300 MG capsule Take 2 capsules (600 mg total) by mouth at bedtime. 02/21/15   Josalyn Funches, MD  methadone (DOLOPHINE) 10 MG/ML solution Take 60 mg by mouth daily. DOSE VERIFIED BY [facility] 12/29/14   Historical Provider, MD  predniSONE (DELTASONE) 50 MG tablet Take 1 pill daily for 5 days. 05/11/15   Charm Rings, MD   Meds Ordered and Administered this Visit  Medications - No data to display  BP 154/88 mmHg  Pulse 65  Temp(Src) 99.2 F (37.3 C) (Oral)  Resp 20  SpO2 98% No data found.   Physical Exam  Constitutional: She is oriented to person, place, and time. She appears well-developed and well-nourished.  Cardiovascular: Normal rate.   Pulmonary/Chest: Effort normal.  Musculoskeletal:  Palpable spasm in bilateral trapezii. Mild spasm and upper back. 5 out of 5 strength in all extremities.  Neurological: She is alert and oriented to person, place, and time. She exhibits normal muscle tone.    ED Course  Procedures (including critical care time)  Labs Review Labs Reviewed - No data to display  Imaging Review No results found.    MDM   1. Muscle spasm of back   2. Acute nonintractable headache, unspecified headache type    Prescriptions for Flexeril and Fioricet given she states these have worked well in the past for the spasms and headaches, respectively. Prescription for prednisone given to be filled if she is not improving in the next few days.    Charm Rings, MD 05/11/15 [phone]

## 2015-05-11 NOTE — Discharge Instructions (Signed)
Please take Flexeril 3 times a day as needed for muscle spasms. Use the Fioricet as needed for your headache. If things are not improving in the next few days, fill the prescription for prednisone. Follow-up as needed.
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
## 2015-05-15 ENCOUNTER — Encounter (HOSPITAL_COMMUNITY): Payer: Self-pay

## 2015-05-15 ENCOUNTER — Emergency Department (HOSPITAL_COMMUNITY)
Admission: EM | Admit: 2015-05-15 | Discharge: 2015-05-15 | Disposition: A | Discharge status: Home / Self Care | Payer: Medicaid Other | Attending: Emergency Medicine | Admitting: Emergency Medicine

## 2015-05-15 DIAGNOSIS — Z8701 Personal history of pneumonia (recurrent): Secondary | ICD-10-CM | POA: Diagnosis not present

## 2015-05-15 DIAGNOSIS — Z87828 Personal history of other (healed) physical injury and trauma: Secondary | ICD-10-CM | POA: Insufficient documentation

## 2015-05-15 DIAGNOSIS — Z720 Tobacco use: Secondary | ICD-10-CM | POA: Diagnosis not present

## 2015-05-15 DIAGNOSIS — F419 Anxiety disorder, unspecified: Secondary | ICD-10-CM | POA: Insufficient documentation

## 2015-05-15 DIAGNOSIS — R634 Abnormal weight loss: Secondary | ICD-10-CM | POA: Diagnosis not present

## 2015-05-15 DIAGNOSIS — Z590 Homelessness: Secondary | ICD-10-CM | POA: Insufficient documentation

## 2015-05-15 DIAGNOSIS — Z86711 Personal history of pulmonary embolism: Secondary | ICD-10-CM | POA: Diagnosis not present

## 2015-05-15 DIAGNOSIS — G4700 Insomnia, unspecified: Secondary | ICD-10-CM | POA: Insufficient documentation

## 2015-05-15 MED ORDER — CLONAZEPAM 1 MG PO TABS: ORAL_TABLET | ORAL | Status: DC

## 2015-05-15 NOTE — Discharge Instructions (Signed)

## 2015-05-15 NOTE — ED Notes (Signed)
Patient states that she is very anxious. Patient states she quit taking her Klonopin 3 months ago and the anxiety is worse.

## 2015-05-15 NOTE — ED Notes (Signed)
Pt very anxious.  Attempted to calm pt while waiting MD arrival.

## 2015-05-15 NOTE — ED Provider Notes (Signed)
CSN: [id number]     Arrival date & time 05/15/15  1211 History   First MD Initiated Contact with Patient 05/15/15 1349     Chief Complaint  Patient presents with  . Anxiety  . Insomnia  . Weight Loss      HPI  Expand All Collapse All   Patient states that she is very anxious. Patient states she quit taking her Klonopin 3 months ago and the anxiety is worse. patient denies fever chills nausea vomiting diarrhea.  She is requesting a seven-day course of Klonopin until she get is here primary care doctor.  Denies any homicidal suicidal thoughts or ideations.          Past Medical History  Diagnosis Date  . Anxiety     was managed with BZDs until begining of May  . Insomnia   . Neck injury   . Back pain   . Pulmonary embolism 2015    treated with warfarin for 6 months, reports it was found by accident  . PTSD (post-traumatic stress disorder)     lost brother to ARDS, father to Alzheimers  . Panic attacks   . Polysubstance abuse     reports narcotics 20 years ago, has been on methadone for 20 years, also admits marijuana   . Homelessness   . Pneumonia 2014   Past Surgical History  Procedure Laterality Date  . Tonsillectomy     Family History  Problem Relation Age of Onset  . Cancer Mother   . Dementia Father    Social History  Substance Use Topics  . Smoking status: Light Tobacco Smoker -- 0.15 packs/day for 10 years    Types: Cigarettes  . Smokeless tobacco: Never Used  . Alcohol Use: No   OB History    No data available     Review of Systems  All other systems reviewed and are negative  Allergies  Contrast media; Erythromycin; and Nsaids  Home Medications   Prior to Admission medications   Medication Sig Start Date End Date Taking? Authorizing Provider  predniSONE (DELTASONE) 50 MG tablet Take 1 pill daily for 5 days. Patient taking differently: Take 50 mg by mouth daily. Started 09/17 for 5 days. 05/11/15  Yes Charm Rings, MD   butalbital-acetaminophen-caffeine (FIORICET) [phone] MG per tablet Take 1-2 tablets by mouth every 6 (six) hours as needed for headache. Patient not taking: Reported on 05/15/2015 05/11/15 05/10/16  Charm Rings, MD  clonazePAM Scarlette Calico) 1 MG tablet Take one tablet daily 05/15/15   Nelva Nay, MD  cyclobenzaprine (FLEXERIL) 5 MG tablet Take 1-2 tablets (5-10 mg total) by mouth 3 (three) times daily as needed for muscle spasms. Patient not taking: Reported on 05/15/2015 05/11/15   Charm Rings, MD  gabapentin (NEURONTIN) 300 MG capsule Take 2 capsules (600 mg total) by mouth at bedtime. Patient not taking: Reported on 05/15/2015 02/21/15   Josalyn Funches, MD   BP 158/78 mmHg  Pulse 71  Temp(Src) 98.5 F (36.9 C) (Oral)  Resp 16  Ht  (1.702 m)  Wt 105 lb (47.628 kg)  BMI 16.44 kg/m2  SpO2 100% Physical Exam Physical Exam  Nursing note and vitals reviewed. Constitutional: She is oriented to person, place, and time. She appears well-developed and well-nourished. No distress.  HENT:  Head: Normocephalic and atraumatic.  Eyes: Pupils are equal, round, and reactive to light.  Neck: Normal range of motion.  Cardiovascular: Normal rate and intact distal pulses.   Pulmonary/Chest: No respiratory  distress.  Abdominal: Normal appearance. She exhibits no distension.  Musculoskeletal: Normal range of motion.  Neurological: She is alert and oriented to person, place, and time. No cranial nerve deficit.  Skin: Skin is warm and dry. No rash noted.  Psychiatric: She has a normal mood and affect. Her behavior is normal.   ED Course  Procedures (including critical care time) Labs Review Labs Reviewed - No data to display  Imaging Review No results found. I have personally reviewed and evaluated these images and lab results as part of my medical decision-making.    MDM   Final diagnoses:  Anxiety        Nelva Nay, MD 05/15/15 [phone]

## 2015-06-12 ENCOUNTER — Emergency Department (HOSPITAL_COMMUNITY)
Admission: EM | Admit: 2015-06-12 | Discharge: 2015-06-12 | Disposition: A | Discharge status: Home / Self Care | Payer: Medicaid Other | Attending: Emergency Medicine | Admitting: Emergency Medicine

## 2015-06-12 ENCOUNTER — Encounter (HOSPITAL_COMMUNITY): Payer: Self-pay

## 2015-06-12 DIAGNOSIS — Z760 Encounter for issue of repeat prescription: Secondary | ICD-10-CM

## 2015-06-12 DIAGNOSIS — Z590 Homelessness: Secondary | ICD-10-CM | POA: Insufficient documentation

## 2015-06-12 DIAGNOSIS — F419 Anxiety disorder, unspecified: Secondary | ICD-10-CM | POA: Insufficient documentation

## 2015-06-12 DIAGNOSIS — Z87828 Personal history of other (healed) physical injury and trauma: Secondary | ICD-10-CM | POA: Diagnosis not present

## 2015-06-12 DIAGNOSIS — Z86711 Personal history of pulmonary embolism: Secondary | ICD-10-CM | POA: Insufficient documentation

## 2015-06-12 DIAGNOSIS — Z79899 Other long term (current) drug therapy: Secondary | ICD-10-CM | POA: Insufficient documentation

## 2015-06-12 DIAGNOSIS — G4700 Insomnia, unspecified: Secondary | ICD-10-CM | POA: Insufficient documentation

## 2015-06-12 DIAGNOSIS — Z720 Tobacco use: Secondary | ICD-10-CM | POA: Insufficient documentation

## 2015-06-12 DIAGNOSIS — Z8701 Personal history of pneumonia (recurrent): Secondary | ICD-10-CM | POA: Diagnosis not present

## 2015-06-12 MED ORDER — DIAZEPAM 5 MG PO TABS
10.0000 mg | ORAL_TABLET | Freq: Once | ORAL | Status: AC
  Administered 2015-06-12: 10 mg via ORAL
  Filled 2015-06-12: qty 2

## 2015-06-12 MED ORDER — DIAZEPAM 10 MG PO TABS: 10.0000 mg | ORAL_TABLET | Freq: Four times a day (QID) | ORAL | Status: DC | PRN

## 2015-06-12 NOTE — ED Notes (Signed)
Need Valium refilled.

## 2015-06-12 NOTE — ED Provider Notes (Signed)
CSN: [id number]     Arrival date & time 06/12/15  2130 History  By signing my name below, I, Evon Slack, attest that this documentation has been prepared under the direction and in the presence of Arthor Captain, PA-C. Electronically Signed: Evon Slack, ED Scribe. 06/12/2015. 10:08 PM.      Chief Complaint  Patient presents with  . Medication Refill   The history is provided by the patient. No language interpreter was used.   HPI Comments: Annette Montgomery is a 59 y.o. female who presents to the Emergency Department for medication refill. Pt is requesting a refill of her valium. She states that she takes  2x a day. Pt states that her last dosage was this morning. Pt does states that she has a Hx of anxiety and panic attacks. Pt states she is due to see her on Thursday in 4 days. Pt doesn't report any other symptoms.   Past Medical History  Diagnosis Date  . Anxiety     was managed with BZDs until begining of May  . Insomnia   . Neck injury   . Back pain   . Pulmonary embolism (HCC) 2015    treated with warfarin for 6 months, reports it was found by accident  . PTSD (post-traumatic stress disorder)     lost brother to ARDS, father to Alzheimers  . Panic attacks   . Polysubstance abuse     reports narcotics 20 years ago, has been on methadone for 20 years, also admits marijuana   . Homelessness   . Pneumonia 2014   Past Surgical History  Procedure Laterality Date  . Tonsillectomy     Family History  Problem Relation Age of Onset  . Cancer Mother   . Dementia Father    Social History  Substance Use Topics  . Smoking status: Light Tobacco Smoker -- 0.15 packs/day for 10 years    Types: Cigarettes  . Smokeless tobacco: Never Used  . Alcohol Use: No   OB History    No data available     Review of Systems 10 Systems reviewed and all are negative for acute change except as noted in the HPI.    Allergies  Contrast media; Erythromycin; and Nsaids  Home  Medications   Prior to Admission medications   Medication Sig Start Date End Date Taking? Authorizing Provider  butalbital-acetaminophen-caffeine (FIORICET) 50-325-40 MG per tablet Take 1-2 tablets by mouth every 6 (six) hours as needed for headache. Patient not taking: Reported on 05/15/2015 05/11/15 05/10/16  Charm Rings, MD  clonazePAM Scarlette Calico) 1 MG tablet Take one tablet daily 05/15/15   Nelva Nay, MD  cyclobenzaprine (FLEXERIL) 5 MG tablet Take 1-2 tablets (5-10 mg total) by mouth 3 (three) times daily as needed for muscle spasms. Patient not taking: Reported on 05/15/2015 05/11/15   Charm Rings, MD  gabapentin (NEURONTIN) 300 MG capsule Take 2 capsules (600 mg total) by mouth at bedtime. Patient not taking: Reported on 05/15/2015 02/21/15   Dessa Phi, MD  predniSONE (DELTASONE) 50 MG tablet Take 1 pill daily for 5 days. Patient taking differently: Take 50 mg by mouth daily. Started 09/17 for 5 days. 05/11/15   Charm Rings, MD   BP 135/67 mmHg  Pulse 73  Temp(Src) 98.6 F (37 C) (Oral)  Resp 18  SpO2 97%   Physical Exam  Constitutional: She is oriented to person, place, and time. She appears well-developed and well-nourished. No distress.  HENT:  Head: Normocephalic and  atraumatic.  Eyes: Conjunctivae and EOM are normal. No scleral icterus.  Neck: Normal range of motion. Neck supple. No tracheal deviation present.  Cardiovascular: Normal rate, regular rhythm and normal heart sounds.  Exam reveals no gallop and no friction rub.   No murmur heard. Pulmonary/Chest: Effort normal and breath sounds normal. No respiratory distress.  Abdominal: Soft. Bowel sounds are normal. She exhibits no distension and no mass. There is no tenderness. There is no guarding.  Musculoskeletal: Normal range of motion.  Neurological: She is alert and oriented to person, place, and time.  Skin: Skin is warm and dry. She is not diaphoretic.  Psychiatric: She has a normal mood and affect. Her  behavior is normal.  Nursing note and vitals reviewed.   ED Course  Procedures (including critical care time) DIAGNOSTIC STUDIES: Oxygen Saturation is 97% on RA, normal by my interpretation.    COORDINATION OF CARE: 10:12 PM-Discussed treatment plan with pt at bedside and pt agreed to plan.     Labs Review Labs Reviewed - No data to display  Imaging Review No results found.    EKG Interpretation None      MDM   Final diagnoses:  Encounter for medication refill    Patient with request for her benzodiazepines. She is complaining of withdrawal symptoms/anxiety. Patient did complain of shortness of breath. However, she declined to be moved worked up and any further way for that complaint. She stating that this is just anxiety. I asked the patient what exactly she wanted from her visit. Also offered to continue her workup. Patient declined repeatedly. She states she just wanted to be discharged. She could walk on her anxiety. She does not appear to be in any eminent life-threatening shortness of breath. This is speaking in full sentences with no abnormal lung sounds, oxygen saturations 100% on room air.    Lucila MaineI, Nguyet Mercer, personally performed the services described in this documentation. All medical record entries made by the scribe were at my direction and in my presence.  I have reviewed the chart and discharge instructions and agree that the record reflects my personal performance and is accurate and complete. Arthor CaptainHarris, Obdulia Steier.  06/13/2015. 2:04 AM.         Arthor CaptainAbigail Candance Bohlman, PA-C 06/13/15 [id number]  Arthor CaptainAbigail Miklos Bidinger, PA-C 06/13/15 [id number]  Rolan BuccoMelanie Belfi, MD 06/13/15 1630

## 2015-06-12 NOTE — ED Notes (Signed)
PA to see and assess patient before RN assessment. See PA assessment. 

## 2015-06-12 NOTE — Discharge Instructions (Signed)
Medicine Refill at the Emergency Department  We have refilled your medicine today, but it is best for you to get refills through your primary health care provider's office. In the future, please plan ahead so you do not need to get refills from the emergency department.  If the medicine we refilled was a maintenance medicine, you may have received only enough to get you by until you are able to see your regular health care provider.     This information is not intended to replace advice given to you by your health care provider. Make sure you discuss any questions you have with your health care provider.     Document Released: 11/30/2003 Document Revised: 09/03/2014 Document Reviewed: 11/20/2013  Elsevier Interactive Patient Education 2016 [employer]

## 2015-06-12 NOTE — ED Notes (Addendum)
Patient requested to see PA because she is "having trouble breathing because of her muscles all being very tight."  Respirations were unlabored, lungs clear and patient looked as if she was in NAD at this time. Harris - PA went and talked to the patient and asked if she needed to check in for shortness of breath or chest tightness. Patient refused and stated, "no, it is because the medicine is not working," which was given 15 minutes prior to this conversation. PA explained to pt that it takes time for the medicine to work and she may wait to see if it does.  Patient then refuses and asks to be released so she may get up and walk, because that seems to help.  This RN discussed discharge instructions and prescription medication with patient, who verbalized understanding.

## 2015-06-13 DIAGNOSIS — F419 Anxiety disorder, unspecified: Secondary | ICD-10-CM | POA: Diagnosis not present

## 2015-06-13 DIAGNOSIS — Y9289 Other specified places as the place of occurrence of the external cause: Secondary | ICD-10-CM | POA: Insufficient documentation

## 2015-06-13 DIAGNOSIS — R42 Dizziness and giddiness: Secondary | ICD-10-CM | POA: Diagnosis present

## 2015-06-13 DIAGNOSIS — Z590 Homelessness: Secondary | ICD-10-CM | POA: Diagnosis not present

## 2015-06-13 DIAGNOSIS — Z8701 Personal history of pneumonia (recurrent): Secondary | ICD-10-CM | POA: Insufficient documentation

## 2015-06-13 DIAGNOSIS — Z79899 Other long term (current) drug therapy: Secondary | ICD-10-CM | POA: Diagnosis not present

## 2015-06-13 DIAGNOSIS — Y9389 Activity, other specified: Secondary | ICD-10-CM | POA: Diagnosis not present

## 2015-06-13 DIAGNOSIS — F1210 Cannabis abuse, uncomplicated: Secondary | ICD-10-CM | POA: Diagnosis not present

## 2015-06-13 DIAGNOSIS — Z720 Tobacco use: Secondary | ICD-10-CM | POA: Insufficient documentation

## 2015-06-13 DIAGNOSIS — R404 Transient alteration of awareness: Secondary | ICD-10-CM | POA: Diagnosis not present

## 2015-06-13 DIAGNOSIS — S6992XA Unspecified injury of left wrist, hand and finger(s), initial encounter: Secondary | ICD-10-CM | POA: Diagnosis not present

## 2015-06-13 DIAGNOSIS — F1310 Sedative, hypnotic or anxiolytic abuse, uncomplicated: Secondary | ICD-10-CM | POA: Diagnosis not present

## 2015-06-13 DIAGNOSIS — S79911A Unspecified injury of right hip, initial encounter: Secondary | ICD-10-CM | POA: Diagnosis not present

## 2015-06-13 DIAGNOSIS — W1839XA Other fall on same level, initial encounter: Secondary | ICD-10-CM | POA: Diagnosis not present

## 2015-06-13 DIAGNOSIS — Y998 Other external cause status: Secondary | ICD-10-CM | POA: Diagnosis not present

## 2015-06-13 DIAGNOSIS — Z86711 Personal history of pulmonary embolism: Secondary | ICD-10-CM | POA: Diagnosis not present

## 2015-06-14 ENCOUNTER — Encounter (HOSPITAL_COMMUNITY): Payer: Self-pay | Admitting: Emergency Medicine

## 2015-06-14 ENCOUNTER — Emergency Department (HOSPITAL_COMMUNITY): Payer: Medicaid Other

## 2015-06-14 ENCOUNTER — Emergency Department (HOSPITAL_COMMUNITY)
Admission: EM | Admit: 2015-06-14 | Discharge: 2015-06-14 | Disposition: A | Discharge status: Home / Self Care | Payer: Medicaid Other | Attending: Emergency Medicine | Admitting: Emergency Medicine

## 2015-06-14 DIAGNOSIS — R404 Transient alteration of awareness: Secondary | ICD-10-CM

## 2015-06-14 DIAGNOSIS — W19XXXA Unspecified fall, initial encounter: Secondary | ICD-10-CM

## 2015-06-14 LAB — CBC
HCT: 36.6 % (ref 36.0–46.0)
Hemoglobin: 12.4 g/dL (ref 12.0–15.0)
MCH: 30.5 pg (ref 26.0–34.0)
MCHC: 33.9 g/dL (ref 30.0–36.0)
MCV: 89.9 fL (ref 78.0–100.0)
PLATELETS: 190 10*3/uL (ref 150–400)
RBC: 4.07 MIL/uL (ref 3.87–5.11)
RDW: 13.9 % (ref 11.5–15.5)
WBC: 5.6 10*3/uL (ref 4.0–10.5)

## 2015-06-14 LAB — BASIC METABOLIC PANEL
Anion gap: 9 (ref 5–15)
BUN: 19 mg/dL (ref 6–20)
CALCIUM: 9.2 mg/dL (ref 8.9–10.3)
CO2: 25 mmol/L (ref 22–32)
CREATININE: 0.91 mg/dL (ref 0.44–1.00)
Chloride: 105 mmol/L (ref 101–111)
GFR calc Af Amer: >60 mL/min (ref >60)
Glucose, Bld: 70 mg/dL (ref 65–99)
Potassium: 3.6 mmol/L (ref 3.5–5.1)
SODIUM: 139 mmol/L (ref 135–145)

## 2015-06-14 LAB — RAPID URINE DRUG SCREEN, HOSP PERFORMED
AMPHETAMINES: NOT DETECTED
BARBITURATES: NOT DETECTED
Benzodiazepines: POSITIVE — AB
COCAINE: NOT DETECTED
OPIATES: NOT DETECTED
TETRAHYDROCANNABINOL: POSITIVE — AB

## 2015-06-14 LAB — URINALYSIS, ROUTINE W REFLEX MICROSCOPIC
BILIRUBIN URINE: NEGATIVE
GLUCOSE, UA: NEGATIVE mg/dL
Hgb urine dipstick: NEGATIVE
KETONES UR: NEGATIVE mg/dL
NITRITE: NEGATIVE
PH: 5.5 (ref 5.0–8.0)
Protein, ur: NEGATIVE mg/dL
SPECIFIC GRAVITY, URINE: 1.011 (ref 1.005–1.030)
Urobilinogen, UA: 0.2 mg/dL (ref 0.0–1.0)

## 2015-06-14 LAB — ETHANOL: Alcohol, Ethyl (B): 10 mg/dL — ABNORMAL HIGH (ref <5)

## 2015-06-14 LAB — CBG MONITORING, ED: Glucose-Capillary: 68 mg/dL (ref 65–99)

## 2015-06-14 LAB — PROTIME-INR
INR: 0.95 (ref 0.00–1.49)
PROTHROMBIN TIME: 12.9 s (ref 11.6–15.2)

## 2015-06-14 LAB — URINE MICROSCOPIC-ADD ON

## 2015-06-14 NOTE — ED Notes (Signed)
MD at bedside. 

## 2015-06-14 NOTE — ED Notes (Signed)
The writer has attempted to wake the pt several times to collect an urine specimen. Pt keeps going back to sleep.

## 2015-06-14 NOTE — ED Notes (Addendum)
Pt reports she became dizzy a few hours ago that started suddenly. reports she fell multiple times walking here. She is not on blood thinners. No loc. Pt lethargic and speech slurred. Denies drug or alcohol use. Pt a&ox 4. Grip strengths equal. C.o pain to R shoulder, R leg. Pt hypotensive in triage. Denies numbness/tingling, cp, sob.

## 2015-06-14 NOTE — ED Notes (Signed)
pt refused to listen to discharge instructions. Pt had an attitude after being informed that there were no cab vouchers/bus passes available. Pt also rushed the writer to "hurry up" and pull up the signature pad so that she could leave since she had to walk 15 mins to get to her destination. Pt stormed out of room. Stated she did not care, did not want her discharge instuctions, and threw them on the bed.

## 2015-06-14 NOTE — ED Provider Notes (Signed)
CSN: [id number]     Arrival date & time 06/13/15  2358 History  By signing my name below, I, Annette Montgomery, attest that this documentation has been prepared under the direction and in the presence of Annette Bilis, MD. Electronically Signed: Placido Montgomery, ED Scribe. 06/14/2015. 1:05 AM.   Chief Complaint  Patient presents with  . Dizziness  . Fall   LEVEL 5 CAVEAT: ALTERED MENTAL STATUS  The history is provided by the patient. The history is limited by the condition of the patient. No language interpreter was used.   HPI Comments: Annette Montgomery is a 59 y.o. female, with a hx of PTSD, homelessness and polysubstance abuse, who presents to the Emergency Department complaining of dizziness with onset PTA. Pt notes living on [address] and began walking and due to an issue with her balance decided to walk to the ED for evaluation. While in route pt notes falling multiple times. She denies taking any prescribed medications although per the nurse's note pt notes taking 10 mg valium this morning. Pt denies any recent ETOH or illegal narcotic use. Pt is responsive to provider's questioning but unclear when answering.   Past Medical History  Diagnosis Date  . Anxiety     was managed with BZDs until begining of May  . Insomnia   . Neck injury   . Back pain   . Pulmonary embolism (HCC) 2015    treated with warfarin for 6 months, reports it was found by accident  . PTSD (post-traumatic stress disorder)     lost brother to ARDS, father to Alzheimers  . Panic attacks   . Polysubstance abuse     reports narcotics 20 years ago, has been on methadone for 20 years, also admits marijuana   . Homelessness   . Pneumonia 2014   Past Surgical History  Procedure Laterality Date  . Tonsillectomy     Family History  Problem Relation Age of Onset  . Cancer Mother   . Dementia Father    Social History  Substance Use Topics  . Smoking status: Light Tobacco Smoker -- 0.15 packs/day for 10  years    Types: Cigarettes  . Smokeless tobacco: Never Used  . Alcohol Use: No   OB History    No data available     Review of Systems  Unable to perform ROS: Mental status change  A complete 10 system review of systems was obtained and all systems are negative except as noted in the HPI and PMH.  Allergies  Contrast media; Erythromycin; and Nsaids  Home Medications   Prior to Admission medications   Medication Sig Start Date End Date Taking? Authorizing Provider  butalbital-acetaminophen-caffeine (FIORICET) 50-325-40 MG per tablet Take 1-2 tablets by mouth every 6 (six) hours as needed for headache. Patient not taking: Reported on 05/15/2015 05/11/15 05/10/16  Charm Rings, MD  clonazePAM Scarlette Calico) 1 MG tablet Take one tablet daily 05/15/15   Nelva Nay, MD  cyclobenzaprine (FLEXERIL) 5 MG tablet Take 1-2 tablets (5-10 mg total) by mouth 3 (three) times daily as needed for muscle spasms. Patient not taking: Reported on 05/15/2015 05/11/15   Charm Rings, MD  diazepam (VALIUM) 10 MG tablet Take 1 tablet (10 mg total) by mouth every 6 (six) hours as needed for anxiety. 06/12/15   Arthor Captain, PA-C  gabapentin (NEURONTIN) 300 MG capsule Take 2 capsules (600 mg total) by mouth at bedtime. Patient not taking: Reported on 05/15/2015 02/21/15   Dessa Phi, MD  predniSONE (DELTASONE) 50 MG tablet Take 1 pill daily for 5 days. Patient taking differently: Take 50 mg by mouth daily. Started 09/17 for 5 days. 05/11/15   Charm RingsErin J Honig, MD   BP 116/58 mmHg  Pulse 48  Temp(Src) 98.5 F (36.9 C) (Oral)  Resp 12  SpO2 100% Physical Exam  Constitutional: She is oriented to person, place, and time. She appears well-developed and well-nourished.  HENT:  Head: Normocephalic and atraumatic.  Eyes: Pupils are equal, round, and reactive to light.  Cardiovascular: Regular rhythm.   Pulmonary/Chest: Effort normal.  Abdominal: Soft.  Musculoskeletal: Normal range of motion.  Pain with ROM of  right hip  Neurological: She is alert and oriented to person, place, and time.  Abnormal finger to nose left hand  Nursing note and vitals reviewed.  ED Course  Procedures  DIAGNOSTIC STUDIES: Oxygen Saturation is 100% on RA, normal by my interpretation.    COORDINATION OF CARE: 1:03 AM Discussed treatment plan with pt at bedside and pt agreed to plan.  Labs Review Labs Reviewed  URINALYSIS, ROUTINE W REFLEX MICROSCOPIC (NOT AT [facility]) - Abnormal; Notable for the following:    APPearance CLOUDY (*)    Leukocytes, UA LARGE (*)    All other components within normal limits  URINE RAPID DRUG SCREEN, HOSP PERFORMED - Abnormal; Notable for the following:    Benzodiazepines POSITIVE (*)    Tetrahydrocannabinol POSITIVE (*)    All other components within normal limits  ETHANOL - Abnormal; Notable for the following:    Alcohol, Ethyl (B) 10 (*)    All other components within normal limits  URINE MICROSCOPIC-ADD ON - Abnormal; Notable for the following:    Squamous Epithelial / LPF FEW (*)    Bacteria, UA MANY (*)    All other components within normal limits  BASIC METABOLIC PANEL  CBC  PROTIME-INR  CBG MONITORING, ED   Imaging Review Ct Head Wo Contrast  06/14/2015  CLINICAL DATA:  Acute onset of dizziness. Multiple falls. Concern for head or cervical spine injury. Initial encounter. EXAM: CT HEAD WITHOUT CONTRAST CT CERVICAL SPINE WITHOUT CONTRAST TECHNIQUE: Multidetector CT imaging of the head and cervical spine was performed following the standard protocol without intravenous contrast. Multiplanar CT image reconstructions of the cervical spine were also generated. COMPARISON:  None. FINDINGS: CT HEAD FINDINGS There is no evidence of acute infarction, mass lesion, or intra- or extra-axial hemorrhage on CT. The posterior fossa, including the cerebellum, brainstem and fourth ventricle, is within normal limits. The third and lateral ventricles, and basal ganglia are unremarkable in  appearance. The cerebral hemispheres are symmetric in appearance, with normal gray-white differentiation. No mass effect or midline shift is seen. There is no evidence of fracture; visualized osseous structures are unremarkable in appearance. The orbits are within normal limits. The paranasal sinuses and mastoid air cells are well-aerated. No significant soft tissue abnormalities are seen. CT CERVICAL SPINE FINDINGS There is no evidence of acute fracture or subluxation. There is minimal grade 1 anterolisthesis of C3 on C4, and mild grade 1 retrolisthesis of C5 on C6. Multilevel disc space narrowing is noted along the cervical spine, with scattered anterior and posterior disc osteophyte complexes. Vertebral bodies demonstrate normal height. Prevertebral soft tissues are within normal limits. The thyroid gland is unremarkable in appearance. The visualized lung apices are clear. Mild calcification is noted at the carotid bifurcations bilaterally. IMPRESSION: 1. No evidence of traumatic intracranial injury or fracture. 2. No evidence of acute fracture or subluxation  along the cervical spine. 3. Mild degenerative change along the cervical spine. 4. Mild calcification at the carotid bifurcations bilaterally. Electronically Signed   By: Roanna Raider M.D.   On: 06/14/2015 02:05   Ct Cervical Spine Wo Contrast  06/14/2015  CLINICAL DATA:  Acute onset of dizziness. Multiple falls. Concern for head or cervical spine injury. Initial encounter. EXAM: CT HEAD WITHOUT CONTRAST CT CERVICAL SPINE WITHOUT CONTRAST TECHNIQUE: Multidetector CT imaging of the head and cervical spine was performed following the standard protocol without intravenous contrast. Multiplanar CT image reconstructions of the cervical spine were also generated. COMPARISON:  None. FINDINGS: CT HEAD FINDINGS There is no evidence of acute infarction, mass lesion, or intra- or extra-axial hemorrhage on CT. The posterior fossa, including the cerebellum,  brainstem and fourth ventricle, is within normal limits. The third and lateral ventricles, and basal ganglia are unremarkable in appearance. The cerebral hemispheres are symmetric in appearance, with normal gray-white differentiation. No mass effect or midline shift is seen. There is no evidence of fracture; visualized osseous structures are unremarkable in appearance. The orbits are within normal limits. The paranasal sinuses and mastoid air cells are well-aerated. No significant soft tissue abnormalities are seen. CT CERVICAL SPINE FINDINGS There is no evidence of acute fracture or subluxation. There is minimal grade 1 anterolisthesis of C3 on C4, and mild grade 1 retrolisthesis of C5 on C6. Multilevel disc space narrowing is noted along the cervical spine, with scattered anterior and posterior disc osteophyte complexes. Vertebral bodies demonstrate normal height. Prevertebral soft tissues are within normal limits. The thyroid gland is unremarkable in appearance. The visualized lung apices are clear. Mild calcification is noted at the carotid bifurcations bilaterally. IMPRESSION: 1. No evidence of traumatic intracranial injury or fracture. 2. No evidence of acute fracture or subluxation along the cervical spine. 3. Mild degenerative change along the cervical spine. 4. Mild calcification at the carotid bifurcations bilaterally. Electronically Signed   By: Roanna Raider M.D.   On: 06/14/2015 02:05   Dg Hip Unilat With Pelvis 2-3 Views Right  06/14/2015  CLINICAL DATA:  Right hip pain after fall. EXAM: DG HIP (WITH OR WITHOUT PELVIS) 2-3V RIGHT COMPARISON:  None. FINDINGS: The cortical margins of the bony pelvis and right hip are intact. No fracture. Degenerative change of both sacroiliac joints. Pubic symphysis is normal. Both femoral heads are well-seated in the respective acetabula. Rounded pelvic calcifications consistent with uterine fibroid. IMPRESSION: No acute bony abnormality. Mild degenerative change  of the sacroiliac joints. Electronically Signed   By: Rubye Oaks M.D.   On: 06/14/2015 02:41   I have personally reviewed and evaluated these images and lab results as part of my medical decision-making.   EKG Interpretation   Date/Time:  Tuesday June 14 2015 00:06:19 EDT Ventricular Rate:  57 PR Interval:  152 QRS Duration: 100 QT Interval:  464 QTC Calculation: 451 R Axis:   83 Text Interpretation:  Sinus bradycardia Otherwise normal ECG No  significant change was found Confirmed by Bobbye Reinitz  MD, Caryn Bee ([id number]) on  06/14/2015 5:05:49 AM      MDM   Final diagnoses:  Fall, initial encounter  Transient alteration of awareness    5:06 AM Patient is much more alert and awake now.  I suspect this is polypharmacy.  I recommend that she decrease her use of benzodiazepines and work with her physicians to taper appropriately.  Patient is nonambulatory in emergency department.  She has no focal weakness.  Labs and imaging without abnormality.  Discharge home in good condition.  Finger-nose examination is now normalized.  doubt TIA/stroke  I, Deanta Mincey M, personally performed the services described in this documentation. All medical record entries made by the scribe were at my direction and in my presence.  I have reviewed the chart and discharge instructions and agree that the record reflects my personal performance and is accurate and complete. Tumeka Chimenti M.  06/14/2015. 5:06 AM.       Annette Bilis, MD 06/14/15 [id number]

## 2015-06-14 NOTE — ED Notes (Signed)
Pt started on Busbar 3-4 days ago and also takes valium bid.

## 2015-06-14 NOTE — ED Notes (Signed)
Pt lethargic. Pt states she last took her 10 mg of valium Monday am. Pt is very lethargic but easy to arouse.

## 2015-06-14 NOTE — ED Notes (Signed)
Pt more alert now. Pt informed that if she is unable to give a sample at this time that she will have to be in and out cathed.  The writer explained to the pt that she has attempted to arouse her on several occasions to get a urine sample. Pt now on bedpan.

## 2015-06-18 ENCOUNTER — Encounter (HOSPITAL_COMMUNITY): Payer: Self-pay | Admitting: Emergency Medicine

## 2015-06-18 ENCOUNTER — Emergency Department (HOSPITAL_COMMUNITY): Payer: Medicaid Other

## 2015-06-18 ENCOUNTER — Encounter (HOSPITAL_COMMUNITY): Payer: Self-pay | Admitting: *Deleted

## 2015-06-18 ENCOUNTER — Emergency Department (HOSPITAL_COMMUNITY)
Admission: EM | Admit: 2015-06-18 | Discharge: 2015-06-18 | Disposition: A | Discharge status: Home / Self Care | Payer: Medicaid Other | Attending: Emergency Medicine | Admitting: Emergency Medicine

## 2015-06-18 ENCOUNTER — Emergency Department (INDEPENDENT_AMBULATORY_CARE_PROVIDER_SITE_OTHER)
Admission: EM | Admit: 2015-06-18 | Discharge: 2015-06-18 | Disposition: A | Discharge status: Home / Self Care | Payer: Medicaid Other | Source: Home / Self Care | Attending: Family Medicine | Admitting: Family Medicine

## 2015-06-18 ENCOUNTER — Other Ambulatory Visit: Payer: Self-pay | Admitting: Nurse Practitioner

## 2015-06-18 DIAGNOSIS — Z79899 Other long term (current) drug therapy: Secondary | ICD-10-CM | POA: Diagnosis not present

## 2015-06-18 DIAGNOSIS — Y92410 Unspecified street and highway as the place of occurrence of the external cause: Secondary | ICD-10-CM | POA: Insufficient documentation

## 2015-06-18 DIAGNOSIS — Z720 Tobacco use: Secondary | ICD-10-CM

## 2015-06-18 DIAGNOSIS — S20212D Contusion of left front wall of thorax, subsequent encounter: Secondary | ICD-10-CM | POA: Diagnosis not present

## 2015-06-18 DIAGNOSIS — F1910 Other psychoactive substance abuse, uncomplicated: Secondary | ICD-10-CM

## 2015-06-18 DIAGNOSIS — F419 Anxiety disorder, unspecified: Secondary | ICD-10-CM | POA: Diagnosis not present

## 2015-06-18 DIAGNOSIS — Z8701 Personal history of pneumonia (recurrent): Secondary | ICD-10-CM | POA: Diagnosis not present

## 2015-06-18 DIAGNOSIS — Z8669 Personal history of other diseases of the nervous system and sense organs: Secondary | ICD-10-CM | POA: Insufficient documentation

## 2015-06-18 DIAGNOSIS — Z87828 Personal history of other (healed) physical injury and trauma: Secondary | ICD-10-CM | POA: Diagnosis not present

## 2015-06-18 DIAGNOSIS — Y999 Unspecified external cause status: Secondary | ICD-10-CM | POA: Diagnosis not present

## 2015-06-18 DIAGNOSIS — Z86711 Personal history of pulmonary embolism: Secondary | ICD-10-CM | POA: Diagnosis not present

## 2015-06-18 DIAGNOSIS — S199XXA Unspecified injury of neck, initial encounter: Secondary | ICD-10-CM | POA: Insufficient documentation

## 2015-06-18 DIAGNOSIS — S299XXA Unspecified injury of thorax, initial encounter: Secondary | ICD-10-CM | POA: Diagnosis not present

## 2015-06-18 DIAGNOSIS — Y9389 Activity, other specified: Secondary | ICD-10-CM | POA: Insufficient documentation

## 2015-06-18 DIAGNOSIS — Z590 Homelessness: Secondary | ICD-10-CM | POA: Insufficient documentation

## 2015-06-18 DIAGNOSIS — Z765 Malingerer [conscious simulation]: Secondary | ICD-10-CM

## 2015-06-18 DIAGNOSIS — Z1231 Encounter for screening mammogram for malignant neoplasm of breast: Secondary | ICD-10-CM

## 2015-06-18 MED ORDER — CARISOPRODOL 350 MG PO TABS: ORAL_TABLET | ORAL | Status: DC
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
## 2015-06-18 NOTE — ED Notes (Signed)
NAD at this time. Pt is stable and going home.  
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
## 2015-06-18 NOTE — Discharge Instructions (Signed)
Return here as needed. Follow up with your doctor. °
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
## 2015-06-19 ENCOUNTER — Encounter (HOSPITAL_COMMUNITY): Payer: Self-pay | Admitting: Family Medicine

## 2015-06-19 ENCOUNTER — Emergency Department (HOSPITAL_COMMUNITY)
Admission: EM | Admit: 2015-06-19 | Discharge: 2015-06-20 | Disposition: A | Discharge status: Home / Self Care | Payer: Medicaid Other | Attending: Emergency Medicine | Admitting: Emergency Medicine

## 2015-06-19 DIAGNOSIS — F1314 Sedative, hypnotic or anxiolytic abuse with sedative, hypnotic or anxiolytic-induced mood disorder: Secondary | ICD-10-CM | POA: Diagnosis not present

## 2015-06-19 DIAGNOSIS — Z8701 Personal history of pneumonia (recurrent): Secondary | ICD-10-CM | POA: Diagnosis not present

## 2015-06-19 DIAGNOSIS — F410 Panic disorder [episodic paroxysmal anxiety] without agoraphobia: Secondary | ICD-10-CM | POA: Insufficient documentation

## 2015-06-19 DIAGNOSIS — Z86711 Personal history of pulmonary embolism: Secondary | ICD-10-CM | POA: Diagnosis not present

## 2015-06-19 DIAGNOSIS — F329 Major depressive disorder, single episode, unspecified: Secondary | ICD-10-CM | POA: Diagnosis present

## 2015-06-19 DIAGNOSIS — F1994 Other psychoactive substance use, unspecified with psychoactive substance-induced mood disorder: Secondary | ICD-10-CM

## 2015-06-19 DIAGNOSIS — M791 Myalgia: Secondary | ICD-10-CM | POA: Insufficient documentation

## 2015-06-19 DIAGNOSIS — Z720 Tobacco use: Secondary | ICD-10-CM | POA: Diagnosis not present

## 2015-06-19 DIAGNOSIS — F1310 Sedative, hypnotic or anxiolytic abuse, uncomplicated: Secondary | ICD-10-CM

## 2015-06-19 DIAGNOSIS — F1120 Opioid dependence, uncomplicated: Secondary | ICD-10-CM

## 2015-06-19 DIAGNOSIS — Z79899 Other long term (current) drug therapy: Secondary | ICD-10-CM | POA: Diagnosis not present

## 2015-06-19 DIAGNOSIS — Z87828 Personal history of other (healed) physical injury and trauma: Secondary | ICD-10-CM | POA: Insufficient documentation

## 2015-06-19 DIAGNOSIS — Z8669 Personal history of other diseases of the nervous system and sense organs: Secondary | ICD-10-CM | POA: Insufficient documentation

## 2015-06-19 DIAGNOSIS — F1124 Opioid dependence with opioid-induced mood disorder: Secondary | ICD-10-CM | POA: Insufficient documentation

## 2015-06-19 DIAGNOSIS — Z590 Homelessness: Secondary | ICD-10-CM | POA: Insufficient documentation

## 2015-06-19 LAB — CBC WITH DIFFERENTIAL/PLATELET
Basophils Absolute: 0 10*3/uL (ref 0.0–0.1)
Basophils Relative: 0 %
EOS ABS: 0.1 10*3/uL (ref 0.0–0.7)
Eosinophils Relative: 2 %
HEMATOCRIT: 34.7 % — AB (ref 36.0–46.0)
HEMOGLOBIN: 11.8 g/dL — AB (ref 12.0–15.0)
LYMPHS ABS: 1.5 10*3/uL (ref 0.7–4.0)
Lymphocytes Relative: 24 %
MCH: 31 pg (ref 26.0–34.0)
MCHC: 34 g/dL (ref 30.0–36.0)
MCV: 91.1 fL (ref 78.0–100.0)
MONOS PCT: 10 %
Monocytes Absolute: 0.7 10*3/uL (ref 0.1–1.0)
NEUTROS ABS: 4 10*3/uL (ref 1.7–7.7)
NEUTROS PCT: 64 %
Platelets: 157 10*3/uL (ref 150–400)
RBC: 3.81 MIL/uL — ABNORMAL LOW (ref 3.87–5.11)
RDW: 14.1 % (ref 11.5–15.5)
WBC: 6.3 10*3/uL (ref 4.0–10.5)

## 2015-06-19 MED ORDER — ALUM & MAG HYDROXIDE-SIMETH 200-200-20 MG/5ML PO SUSP: 30.0000 mL | ORAL | Status: DC | PRN

## 2015-06-19 MED ORDER — ONDANSETRON HCL 4 MG PO TABS: 4.0000 mg | ORAL_TABLET | Freq: Three times a day (TID) | ORAL | Status: DC | PRN

## 2015-06-19 MED ORDER — NICOTINE 21 MG/24HR TD PT24
21.0000 mg | MEDICATED_PATCH | Freq: Every day | TRANSDERMAL | Status: DC
  Administered 2015-06-20: 21 mg via TRANSDERMAL
  Filled 2015-06-19 (×2): qty 1

## 2015-06-19 MED ORDER — LORAZEPAM 1 MG PO TABS
1.0000 mg | ORAL_TABLET | Freq: Three times a day (TID) | ORAL | Status: DC | PRN
  Administered 2015-06-20 (×2): 1 mg via ORAL
  Filled 2015-06-19 (×2): qty 1

## 2015-06-19 MED ORDER — ACETAMINOPHEN 325 MG PO TABS
650.0000 mg | ORAL_TABLET | ORAL | Status: DC | PRN
  Filled 2015-06-19: qty 2

## 2015-06-19 MED ORDER — ZOLPIDEM TARTRATE 5 MG PO TABS
5.0000 mg | ORAL_TABLET | Freq: Every evening | ORAL | Status: DC | PRN
  Administered 2015-06-20: 5 mg via ORAL
  Filled 2015-06-19: qty 1

## 2015-06-19 NOTE — BH Assessment (Signed)
Assessment completed. Consulted Hulan FessIjeoma Nwaeze, NP who recommended that pt be re-evaluated by psychiatry in the morning.

## 2015-06-19 NOTE — ED Notes (Signed)
Asked patient to go urinate but she stated "you need to give me some time".

## 2015-06-19 NOTE — [facility] Counselor (Signed)
Called [facility] charge nurse spoke with Annette Montgomery to order cart at 10:03 p.m. Emanuele Mcwhirter K. Sherlon HandingHarris, LCAS-A, LPC-A, [facility]  Counselor 06/19/2015 10:04 PM
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
## 2015-06-19 NOTE — ED Notes (Signed)
Pt consented for clothes to be washed due to her having a bowel movement. Pt has black t-shirt, black jacket, black belt, and black pants that are being laundered.

## 2015-06-19 NOTE — ED Notes (Signed)
Clothing has completed washing/drying. Belongings placed in patients bag. Belongings transported with patient to [facility].

## 2015-06-19 NOTE — BHH Counselor (Signed)
Attempted to perform tele psych, pt. Refused and was uncooperative. Pt stated she did not want to speak with a television screen. Asked pt if she would like someone to come in and talk to her, and pt stated yes. [facility], Autumn, Charity fundraiserN.  Tele psych ended at 10:22 p.m. On 06/19/15 Tama Grosz K. Sherlon HandingHarris, LCAS-A, LPC-A, [facility]  Counselor 06/19/2015 10:33 PM

## 2015-06-19 NOTE — BH Assessment (Signed)
Spoke with Shean, TTS who reported that pt did not participate in the tele-assessment due to requesting to speak to someone in person. Counselor will assess pt.
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
## 2015-06-20 DIAGNOSIS — F1120 Opioid dependence, uncomplicated: Secondary | ICD-10-CM | POA: Diagnosis not present

## 2015-06-20 DIAGNOSIS — F1994 Other psychoactive substance use, unspecified with psychoactive substance-induced mood disorder: Secondary | ICD-10-CM | POA: Diagnosis not present

## 2015-06-20 DIAGNOSIS — F1310 Sedative, hypnotic or anxiolytic abuse, uncomplicated: Secondary | ICD-10-CM

## 2015-06-20 LAB — ETHANOL: Alcohol, Ethyl (B): <5 mg/dL (ref <5)

## 2015-06-20 LAB — RAPID URINE DRUG SCREEN, HOSP PERFORMED
Amphetamines: NOT DETECTED
BARBITURATES: NOT DETECTED
BENZODIAZEPINES: POSITIVE — AB
COCAINE: NOT DETECTED
Opiates: NOT DETECTED
TETRAHYDROCANNABINOL: NOT DETECTED

## 2015-06-20 LAB — BASIC METABOLIC PANEL
ANION GAP: 6 (ref 5–15)
BUN: 14 mg/dL (ref 6–20)
CALCIUM: 8.9 mg/dL (ref 8.9–10.3)
CO2: 28 mmol/L (ref 22–32)
Chloride: 105 mmol/L (ref 101–111)
Creatinine, Ser: 0.83 mg/dL (ref 0.44–1.00)
GFR calc Af Amer: >60 mL/min (ref >60)
GFR calc non Af Amer: >60 mL/min (ref >60)
GLUCOSE: 75 mg/dL (ref 65–99)
Potassium: 3.3 mmol/L — ABNORMAL LOW (ref 3.5–5.1)
Sodium: 139 mmol/L (ref 135–145)

## 2015-06-20 MED ORDER — CYCLOBENZAPRINE HCL 10 MG PO TABS
5.0000 mg | ORAL_TABLET | Freq: Once | ORAL | Status: AC
Start: 2015-06-20 — End: 2015-06-20
  Administered 2015-06-20: 5 mg via ORAL

## 2015-06-20 MED ORDER — CYCLOBENZAPRINE HCL 10 MG PO TABS
ORAL_TABLET | ORAL | Status: AC
  Filled 2015-06-20: qty 1
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
## 2015-06-20 NOTE — Discharge Instructions (Signed)
To help you maintain a sober lifestyle, a substance abuse treatment program may be beneficial to you.  You have expressed an interest in finding a methadone clinic.  Contact one of the following treatment providers to ask about enrolling in their program:       [facility]      [address]      [place], [place] [address]      [phone]      New patients are seen at the walk-in clinic every Tuesday from 9:00 am - 12:00 pm.       [facility]      [address]      [place], [place] [address]      [phone]

## 2015-06-20 NOTE — BH Assessment (Signed)
[facility] Assessment Progress Note  Per Thedore MinsMojeed Akintayo, MD, this pt does not require psychiatric hospitalization at this time.  [facility]  She has expressed a preference for methadone treatment.  Discharge instructions advise pt to follow up with the methadone programs [facility] and [facility].  Pt's nurse, Dawnaly, has been notified.  Annette Canninghomas Raeli Wiens, MA Triage Specialist [phone]

## 2015-06-20 NOTE — ED Notes (Signed)
Patient irritable, expresses hopelessness. Endorses SI. Denies HI, AVH. Rates anxiety and depression 10/10. States she is having a panic attack and it takes three days to recover. States she is constant pain. States that she will not take Tylenol. Also declines heating pad. Patient requests Klonopin. States Ativan doesn't work like Arts development officerKlonopin.  Encouragement offered. Environment adjusted.  Q 15 safety checks in place.

## 2015-06-20 NOTE — [facility] Suicide Risk Assessment (Signed)
Suicide Risk Assessment  Discharge Assessment   [facility]   Demographic Factors:  Caucasian  Total Time spent with patient: 45 minutes  Musculoskeletal: Strength & Muscle Tone: within normal limits Gait & Station: normal Patient leans: N/A  Psychiatric Specialty Exam: Review of Systems  Constitutional: Negative.   HENT: Negative.   Eyes: Negative.   Respiratory: Negative.   Cardiovascular: Negative.   Gastrointestinal: Negative.   Genitourinary: Negative.   Musculoskeletal: Negative.   Skin: Negative.   Neurological: Negative.   Endo/Heme/Allergies: Negative.   Psychiatric/Behavioral: Positive for substance abuse.    Blood pressure 134/78, pulse 76, temperature 98.6 F (37 C), temperature source Oral, resp. rate 20, height 5\' 7"  (1.702 m), weight 53.071 kg (117 lb), SpO2 100 %.Body mass index is 18.32 kg/(m^2).  General Appearance: Disheveled  Eye Contact::  Good  Speech:  Normal Rate  Volume:  Normal  Mood:  Irritable  Affect:  Congruent  Thought Process:  Coherent  Orientation:  Full (Time, Place, and Person)  Thought Content:  WDL  Suicidal Thoughts:  No  Homicidal Thoughts:  No  Memory:  Immediate;   Good Recent;   Good Remote;   Good  Judgement:  Fair  Insight:  Lacking  Psychomotor Activity:  Normal  Concentration:  Good  Recall:  Good  Fund of Knowledge:Good  Language: Good  Akathisia:  No  Handed:  Right  AIMS (if indicated):     Assets:  Leisure Time Physical Health Resilience  ADL's:  Intact  Cognition: WNL  Sleep:         Has this patient used any form of tobacco in the last 30 days? (Cigarettes, Smokeless Tobacco, Cigars, and/or Pipes) Yes, A prescription for an FDA-approved tobacco cessation medication was offered at discharge and the patient refused  Mental Status Per Nursing Assessment::   On Admission:  Suicidal, "if I don't get my medications-Klonopin and Methadone"  Current Mental Status by  Physician: NA  Loss Factors: NA  Historical Factors: NA  Risk Reduction Factors:   Positive social support  Continued Clinical Symptoms:  Irritability  Cognitive Features That Contribute To Risk:  None    Suicide Risk:  Minimal: No identifiable suicidal ideation.  Patients presenting with no risk factors but with morbid ruminations; may be classified as minimal risk based on the severity of the depressive symptoms  Principal Problem: Substance induced mood disorder [facility](HCC) Discharge Diagnoses:  Patient Active Problem List   Diagnosis Date Noted  . Substance induced mood disorder (HCC) [F19.94] 06/20/2015    Priority: High  . Methadone dependence ([facility]CC) [F11.20] 01/05/2015    Priority: High  . Benzodiazepine abuse [F13.10] 12/30/2014    Priority: High  . COPD (chronic obstructive pulmonary disease) (HCC) [J44.9] 01/19/2015  . Tobacco abuse [Z72.0] 01/12/2015  . COPD exacerbation (HCC) [J44.1] 01/05/2015  . Anxiety [F41.9] 01/05/2015  . Polysubstance abuse [F19.10] 12/30/2014  . Mood disorder (HCC) [F39] 11/29/2014      Plan Of Care/Follow-up recommendations:  Activity:  as tolerated Diet:  heart healthy diet  Is patient on multiple antipsychotic therapies at discharge:  No   Has Patient had three or more failed trials of antipsychotic monotherapy by history:  No  Recommended Plan for Multiple Antipsychotic Therapies: NA    LORD, JAMISON, PMH-NP 06/20/2015, 10:27 AM

## 2015-06-20 NOTE — ED Notes (Signed)
Patient discharged to home.  She was given a bus pass and resources for methadone clinics in the area.  She denies suicidal ideation, but is irritable.

## 2015-06-20 NOTE — ED Notes (Signed)
Patient now stating "Give me everything" Remains irritable, argumentative. States that she wants her Methadone as she realized the time of day. Informed patient that psychiatry can determine to order it for her when they round.

## 2015-06-20 NOTE — ED Notes (Signed)
Patient yelling, argumentative. States that she wants her "regular" medications. Informed patient of what has been ordered. States she wants something that works. Patient wants Valium and Klonopin. Patient yelling, moaning, and attempting to slam her door.

## 2015-06-26 ENCOUNTER — Encounter (HOSPITAL_COMMUNITY): Payer: Self-pay | Admitting: Emergency Medicine

## 2015-06-26 ENCOUNTER — Emergency Department (HOSPITAL_COMMUNITY)
Admission: EM | Admit: 2015-06-26 | Discharge: 2015-06-26 | Disposition: A | Discharge status: Home / Self Care | Payer: Medicaid Other | Attending: Emergency Medicine | Admitting: Emergency Medicine

## 2015-06-26 ENCOUNTER — Emergency Department (HOSPITAL_COMMUNITY): Payer: Medicaid Other

## 2015-06-26 DIAGNOSIS — Y9289 Other specified places as the place of occurrence of the external cause: Secondary | ICD-10-CM | POA: Diagnosis not present

## 2015-06-26 DIAGNOSIS — Y998 Other external cause status: Secondary | ICD-10-CM | POA: Insufficient documentation

## 2015-06-26 DIAGNOSIS — F410 Panic disorder [episodic paroxysmal anxiety] without agoraphobia: Secondary | ICD-10-CM | POA: Diagnosis not present

## 2015-06-26 DIAGNOSIS — Z86711 Personal history of pulmonary embolism: Secondary | ICD-10-CM | POA: Insufficient documentation

## 2015-06-26 DIAGNOSIS — G8929 Other chronic pain: Secondary | ICD-10-CM | POA: Insufficient documentation

## 2015-06-26 DIAGNOSIS — G4700 Insomnia, unspecified: Secondary | ICD-10-CM | POA: Diagnosis not present

## 2015-06-26 DIAGNOSIS — Z720 Tobacco use: Secondary | ICD-10-CM | POA: Insufficient documentation

## 2015-06-26 DIAGNOSIS — Z590 Homelessness: Secondary | ICD-10-CM | POA: Diagnosis not present

## 2015-06-26 DIAGNOSIS — Z79899 Other long term (current) drug therapy: Secondary | ICD-10-CM | POA: Insufficient documentation

## 2015-06-26 DIAGNOSIS — S299XXA Unspecified injury of thorax, initial encounter: Secondary | ICD-10-CM | POA: Diagnosis present

## 2015-06-26 DIAGNOSIS — M549 Dorsalgia, unspecified: Secondary | ICD-10-CM

## 2015-06-26 DIAGNOSIS — R110 Nausea: Secondary | ICD-10-CM | POA: Diagnosis not present

## 2015-06-26 DIAGNOSIS — Z8701 Personal history of pneumonia (recurrent): Secondary | ICD-10-CM | POA: Diagnosis not present

## 2015-06-26 DIAGNOSIS — Y9389 Activity, other specified: Secondary | ICD-10-CM | POA: Insufficient documentation

## 2015-06-26 MED ORDER — OXYCODONE HCL 5 MG PO TABS
10.0000 mg | ORAL_TABLET | Freq: Once | ORAL | Status: AC
Start: 2015-06-26 — End: 2015-06-26
  Administered 2015-06-26: 10 mg via ORAL
  Filled 2015-06-26: qty 2

## 2015-06-26 MED ORDER — BACLOFEN 10 MG PO TABS
10.0000 mg | ORAL_TABLET | Freq: Three times a day (TID) | ORAL | Status: DC
  Administered 2015-06-26: 10 mg via ORAL
  Filled 2015-06-26 (×3): qty 1

## 2015-06-26 MED ORDER — BACLOFEN 10 MG PO TABS: 10.0000 mg | ORAL_TABLET | Freq: Three times a day (TID) | ORAL | Status: DC

## 2015-06-26 MED ORDER — HYDROMORPHONE HCL 1 MG/ML IJ SOLN
1.0000 mg | Freq: Once | INTRAMUSCULAR | Status: AC
  Administered 2015-06-26: 1 mg via INTRAMUSCULAR
  Filled 2015-06-26: qty 1

## 2015-06-26 MED ORDER — OXYCODONE HCL 5 MG PO TABS
10.0000 mg | ORAL_TABLET | Freq: Once | ORAL | Status: AC
  Administered 2015-06-26: 10 mg via ORAL
  Filled 2015-06-26: qty 2

## 2015-06-26 NOTE — ED Notes (Signed)
[employer] at bedside 
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
## 2015-06-26 NOTE — ED Notes (Signed)
Patient ambulated from stretcher to door, opened door, "please somebody come in here" began to moan and cry loudly with door cracked. RN Johna RolesJakeema notified
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
## 2015-06-26 NOTE — Discharge Instructions (Signed)
Back Exercises °If you have pain in your back, do these exercises 2-3 times each day or as told by your doctor. When the pain goes away, do the exercises once each day, but repeat the steps more times for each exercise (do more repetitions). If you do not have pain in your back, do these exercises once each day or as told by your doctor. °EXERCISES °Single Knee to Chest °Do these steps 3-5 times in a row for each leg: °1. Lie on your back on a firm bed or the floor with your legs stretched out. °2. Bring one knee to your chest. °3. Hold your knee to your chest by grabbing your knee or thigh. °4. Pull on your knee until you feel a gentle stretch in your lower back. °5. Keep doing the stretch for 10-30 seconds. °6. Slowly let go of your leg and straighten it. °Pelvic Tilt °Do these steps 5-10 times in a row: °1. Lie on your back on a firm bed or the floor with your legs stretched out. °2. Bend your knees so they point up to the ceiling. Your feet should be flat on the floor. °3. Tighten your lower belly (abdomen) muscles to press your lower back against the floor. This will make your tailbone point up to the ceiling instead of pointing down to your feet or the floor. °4. Stay in this position for 5-10 seconds while you gently tighten your muscles and breathe evenly. °Cat-Cow °Do these steps until your lower back bends more easily: °1. Get on your hands and knees on a firm surface. Keep your hands under your shoulders, and keep your knees under your hips. You may put padding under your knees. °2. Let your head hang down, and make your tailbone point down to the floor so your lower back is round like the back of a cat. °3. Stay in this position for 5 seconds. °4. Slowly lift your head and make your tailbone point up to the ceiling so your back hangs low (sags) like the back of a cow. °5. Stay in this position for 5 seconds. °Press-Ups °Do these steps 5-10 times in a row: °1. Lie on your belly (face-down) on the  floor. °2. Place your hands near your head, about shoulder-width apart. °3. While you keep your back relaxed and keep your hips on the floor, slowly straighten your arms to raise the top half of your body and lift your shoulders. Do not use your back muscles. To make yourself more comfortable, you may change where you place your hands. °4. Stay in this position for 5 seconds. °5. Slowly return to lying flat on the floor. °Bridges °Do these steps 10 times in a row: °1. Lie on your back on a firm surface. °2. Bend your knees so they point up to the ceiling. Your feet should be flat on the floor. °3. Tighten your butt muscles and lift your butt off of the floor until your waist is almost as high as your knees. If you do not feel the muscles working in your butt and the back of your thighs, slide your feet 1-2 inches farther away from your butt. °4. Stay in this position for 3-5 seconds. °5. Slowly lower your butt to the floor, and let your butt muscles relax. °If this exercise is too easy, try doing it with your arms crossed over your chest. °Belly Crunches °Do these steps 5-10 times in a row: °1. Lie on your back on a firm bed   or the floor with your legs stretched out. °2. Bend your knees so they point up to the ceiling. Your feet should be flat on the floor. °3. Cross your arms over your chest. °4. Tip your chin a little bit toward your chest but do not bend your neck. °5. Tighten your belly muscles and slowly raise your chest just enough to lift your shoulder blades a tiny bit off of the floor. °6. Slowly lower your chest and your head to the floor. °Back Lifts °Do these steps 5-10 times in a row: °1. Lie on your belly (face-down) with your arms at your sides, and rest your forehead on the floor. °2. Tighten the muscles in your legs and your butt. °3. Slowly lift your chest off of the floor while you keep your hips on the floor. Keep the back of your head in line with the curve in your back. Look at the floor while  you do this. °4. Stay in this position for 3-5 seconds. °5. Slowly lower your chest and your face to the floor. °GET HELP IF: °· Your back pain gets a lot worse when you do an exercise. °· Your back pain does not lessen 2 hours after you exercise. °If you have any of these problems, stop doing the exercises. Do not do them again unless your doctor says it is okay. °GET HELP RIGHT AWAY IF: °· You have sudden, very bad back pain. If this happens, stop doing the exercises. Do not do them again unless your doctor says it is okay. °  °This information is not intended to replace advice given to you by your health care provider. Make sure you discuss any questions you have with your health care provider. °  °Document Released: 09/15/2010 Document Revised: 05/04/2015 Document Reviewed: 10/07/2014 °Elsevier Interactive Patient Education ©2016 [employer]vier Inc[employer] ° ° °Emergency Department Resource Guide °1) Find a Doctor and Pay Out of Pocket °Although you won't have to find out who is covered by your insurance plan, it is a good idea to ask around and get recommendations. You will then need to call the office and see if the doctor you have chosen will accept you as a new patient and what types of options they offer for patients who are self-pay. Some doctors offer discounts or will set up payment plans for their patients who do not have insurance, but you will need to ask so you aren't surprised when you get to your appointment. ° °2) Contact Your Local Health Department °Not all health departments have doctors that can see patients for sick visits, but many do, so it is worth a call to see if yours does. If you don't know where your local health department is, you can check in your phone book. The CDC also has a tool to help you locate your state's health department, and many state websites also have listings of all of their local health departments. ° °3) Find a Walk-in Clinic °If your illness is not likely to be very severe or  complicated, you may want to try a walk in clinic. These are popping up all over the country in pharmacies, drugstores, and shopping centers. They're usually staffed by nurse practitioners or physician assistants that have been trained to treat common illnesses and complaints. They're usually fairly quick and inexpensive. However, if you have serious medical issues or chronic medical problems, these are probably not your best option. ° °No Primary Care Doctor: °- Call [facility] at  832-8000 -   they can help you locate a primary care doctor that  accepts your insurance, provides certain services, etc. °- Physician Referral Service- 1-800-533-3463 ° °Chronic Pain Problems: °Organization         Address  Phone   Notes  °[facility]  (336) 297-2271 Patients need to be referred by their primary care doctor.  ° °Medication Assistance: °Organization         Address  Phone   Notes  °[place] [facility] [address] °[place], [place] [address] (336) 641-8030 --Must be a resident of [place] °-- Must have NO insurance coverage whatsoever (no Medicaid/ Medicare, etc.) °-- The pt. MUST have a primary care doctor that directs their care regularly and follows them in the community °  °[facility]  (866) 331-1348   °[employer]  (888) 892-1162   ° °Agencies that provide inexpensive medical care: °Organization         Address  Phone   Notes  °[facility]  (336) 832-8035   °[facility]    (336) 832-7272   °[facility] [address] °[place], [place] [address] (336) 832-4777   °[facility] [address], °[place] (336) 271-4999   °[facility]    (336) 373-0678   °[facility]    (336) 272-1050   °[facility] ° [address], [place] Phone:  (336) 832-4444, Fax:  (336) 832-4440 Hours of Operation:  9 am - 6 pm, M-F.  Also accepts  Medicaid/Medicare and self-pay.  °[facility] ° [address], [place] Phone: (336) 832-3150, Fax: (336) [phone]. Hours of Operation:  8:30 am - 5:30 pm, M-F.  Also accepts Medicaid and self-pay.  °[facility] [place] [address], [place] Phone: (336) 878-6027   °[facility] [address], [place], [place] (336)723-1848, Ext. 123 Mondays & Thursdays: 7-9 AM.  First 15 patients are seen on a first come, first serve basis. °  ° °Medicaid-accepting [place] Providers: ° °Organization         Address  Phone   Notes  °[facility] [address], [place] (336) 641-2100 Also accepts self-pay patients.  °[facility] [address], [place] ° (336) 856-9996   °[facility] [address], [place] (336) 288-8857   °[facility] [address] [place] [address], [place] (336) 299-7000   °Veita Bland [address], [place]  ° (336) 373-1557 Only accepts [place] Access Medicaid patients after they have their name applied to their card.  ° °Self-Pay (no insurance) in [place]: ° °Organization         Address  Phone   Notes  °Sickle Cell Patients, [facility] [address], [place] (336) 832-1970   °[facility] [address], [place] (336) 832-4400   °[facility] ° [address], [place] (336) 992-4800   °[facility]/Dr. Osei-Bonsu ° [address] [place] [address], [place] or [address], [place] (336) 841-8500 Phone number for both [place] and [place] locations is the same.  °[facility] [address], [place] (336) 299-0000   °[facility] [address] [place] [address], [place] or [address] (336) 852-7530 °(336) 878-2260   °[facility] [address], [place] (336)  350-1642, phone; (336) 294-5005, fax Sees patients 1st and 3rd Saturday of every month.  Must not qualify for public or private insurance (i.e. Medicaid, Medicare, [place] Health Choice, Veterans' Benefits) • Household income should be no more than 200% of the poverty level •The clinic cannot treat you if you are pregnant or think you are pregnant • Sexually transmitted diseases are not treated at the clinic.  ° ° °Dental Care: °Organization         Address  Phone  Notes  °[facility] [address], [place] (336) 641-6152 Accepts children up to age 21 who are enrolled in Medicaid or [place] Health Choice; pregnant women with a Medicaid card; and children who have applied for Medicaid or [place] Health Choice, but were declined, whose parents can pay a reduced fee at time of service.  °[facility] [place]  [address], [place] (336) 641-7733 Accepts children up to age 21 who are enrolled in Medicaid or [place] Health Choice; pregnant women with a Medicaid card; and children who have applied for Medicaid or [place] Health Choice, but were declined, whose parents can pay a reduced fee at time of service.  °[facility] ° [address], [place] (336) 641-4533 Patients are seen by appointment only. Walk-ins are not accepted. [facility] will see patients 18 years of age and older. °Monday - Tuesday (8am-5pm) °Most Wednesdays (8:30-5pm) °$30 per visit, cash only  °[facility] ° [address], [place] (336) 641-4533 Patients are seen by appointment only. Walk-ins are not accepted. [facility] will see patients 18 years of age and older. °One Wednesday Evening (Monthly: Volunteer Based).  $30 per visit, cash only  °[facility]  (919) 537-3737 for adults; Children under age 4, call Graduate Pediatric Dentistry at ([phone]) [phone]. Children aged  4-14, please call (919) [phone] to request a pediatric application. ° Dental services are provided in all areas of dental care including fillings, crowns and bridges, complete and partial dentures, implants, gum treatment, root canals, and extractions. Preventive care is also provided. Treatment is provided to both adults and children. °Patients are selected via a lottery and there is often a waiting list. °  °[facility] [address], °[place] ° (336) 763-8833 [email or web address] °  °[facility] [address], [place], [place] (336)723-1848, Ext. 123 Second and Fourth Thursday of each month, opens at 6:30 AM; Clinic ends at 9 AM.  Patients are seen on a first-come first-served basis, and a limited number are seen during each clinic.  ° °[facility] ° [address], [place], [place] (336) 723-7904   Eligibility Requirements °You must have lived in [place], [place], or [place] counties for at least the last three months. °  You cannot be eligible for state or federal sponsored healthcare insurance, including Veterans Administration, Medicaid, or Medicare. °  You generally cannot be eligible for healthcare insurance through your employer.  °  How to apply: °Eligibility screenings are held every Tuesday and Wednesday afternoon from 1:00 pm until 4:00 pm. You do not need an appointment for the interview!  °[facility] [address], [place], [place] 336-631-2330   °[facility]  336-342-8273   °[facility]  336-703-3100   °[facility]  336-570-6415   ° °Behavioral Health Resources in the Community: °Intensive Outpatient Programs °Organization         Address  Phone  Notes  °[facility] [address], [place], [place] 336-878-6098   °[facility] [address], [place], [place] 336-832-9800   °[facility] [address],  [place], [place] ° 336-882-2125   °[facility] [address],  °[place], [place] 1-800-853-5163 or 336-641-4981   °Substance Abuse Resources °Organization         Address  Phone  Notes  °[facility]  336-882-2125   °[facility]  336-784-9470   °[facility]  336-285-9073   °[facility]  336-845-3988   °Residential & Outpatient Substance Abuse Program  1-800-659-3381   °Psychological Services °Organization         Address  Phone  Notes  °[facility]  336- 832-9600   °[facility]  336- 378-7881   °[facility] [address], [place] 1-800-853-5163 or 336-641-4981   ° °Mobile Crisis Teams °Organization         Address  Phone  Notes  °[facility], Mobile Crisis Care Unit  1-877-626-1772   °Assertive °[facility] ° [address] [place], [place] 336-834-9664   °Sharon DeEsch [address] °[place] 336-554-5454   ° °Self-Help/Support Groups °Organization         Address  Phone             Notes  °[facility] - variety of support groups  336- 373-1402 Call for more information  °Narcotics Anonymous (NA), [facility] [address], °[place]  2 meetings at this location  ° °Residential Treatment Programs °Organization         Address  Phone  Notes  °[facility] [address],    °[place]  1-866-801-8205   °[facility] ° [address], [place], [place] 704-293-8524   °[facility] [address], [place] 336-845-3988 Admissions: 8am-3pm M-F  °[facility] [address]    °[place], [place] 336-841-1104   °[facility] [address], [place], [place] 336-379-7146   °[facility] [address]  °[place], [place] 336-285-9073   °[facility] - Intensive Outpatient [address], [place], [place] 336-852-3033   °[facility] [address]  °[place], [place] 1-877-615-2722 or 336-784-9470   °[facility] [address] [place], [place] 336-227-7417 Accepts Medicaid  °[facility] [address]  ° [place] 1-800-659-3381 Substance Abuse/Addiction Treatment  ° °[place] Behavioral Health Resources °Organization         Address  Phone  Notes  °[facility]  (888) 581-9988   °Julie Brannon, PhD [address] [place], [place]   (336) 349-5553 or (336) 951-0000   °[facility]   [address] °[place], [place] (336) 349-4454   °[facility] [address], [place], [place] (336) 342-8316 Insurance/Medicaid/sponsorship through [facility]  °[facility] [address]                                      [place], [place] (336) 342-8316 Therapy/tele-psych/case  °[facility] [address]  ° [place], [place] (336) 349-2233    °Dr. Arfeen  (336) 349-4544   °[facility] [place]  [employer] [place] [facility] 1) [address], [place] °2) [address], [place] °3)  [address], [place] (336) 349-3220 °(336) 342-7768 ° °(336) 342-8140   °[place] [facility] (336) 342-1394 or (336) 342-3537 (After Hours)    ° ° ° °

## 2015-06-26 NOTE — ED Notes (Signed)
Patient transported to X-ray 

## 2015-06-26 NOTE — ED Notes (Signed)
Patient sitting on the side of bed rocking back and forth as she recalls events of yesterday.

## 2015-06-26 NOTE — ED Notes (Addendum)
Patient states she is not taking the 10mg  oxycodone because it does not help. Patient is asked to either sit on the bed or a chair, which ever is more comfortable and she becomes upset. She states "keep the damn baclofen too". Provider made aware. Patient wants the door closed, but for safety patient's door will remain open.
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
## 2015-06-26 NOTE — ED Notes (Signed)
SANE at bedside

## 2015-06-26 NOTE — ED Notes (Signed)
Patient keeps getting up and down after asked to not do so. She has non-skid socks on and rounding is being done frequently.

## 2015-07-19 ENCOUNTER — Ambulatory Visit: Payer: Medicaid Other | Attending: Family Medicine | Admitting: Family Medicine

## 2015-07-19 ENCOUNTER — Encounter: Payer: Self-pay | Admitting: Family Medicine

## 2015-07-19 VITALS — BP 184/84 | HR 65 | Temp 98.4°F | Resp 16 | Ht 67.0 in | Wt 120.0 lb

## 2015-07-19 DIAGNOSIS — Z5189 Encounter for other specified aftercare: Secondary | ICD-10-CM | POA: Diagnosis not present

## 2015-07-19 DIAGNOSIS — R079 Chest pain, unspecified: Secondary | ICD-10-CM | POA: Insufficient documentation

## 2015-07-19 DIAGNOSIS — I10 Essential (primary) hypertension: Secondary | ICD-10-CM | POA: Insufficient documentation

## 2015-07-19 DIAGNOSIS — Z79899 Other long term (current) drug therapy: Secondary | ICD-10-CM | POA: Insufficient documentation

## 2015-07-19 DIAGNOSIS — F17210 Nicotine dependence, cigarettes, uncomplicated: Secondary | ICD-10-CM | POA: Insufficient documentation

## 2015-07-19 DIAGNOSIS — M542 Cervicalgia: Secondary | ICD-10-CM | POA: Diagnosis not present

## 2015-07-19 DIAGNOSIS — F419 Anxiety disorder, unspecified: Secondary | ICD-10-CM | POA: Insufficient documentation

## 2015-07-19 MED ORDER — HYDRALAZINE HCL 10 MG PO TABS: 10.0000 mg | ORAL_TABLET | Freq: Two times a day (BID) | ORAL | Status: DC

## 2015-07-19 MED ORDER — BACLOFEN 20 MG PO TABS: 20.0000 mg | ORAL_TABLET | Freq: Two times a day (BID) | ORAL | Status: DC
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
## 2015-07-19 NOTE — Assessment & Plan Note (Signed)
A; HTN today with bradycardia.  P: Hydralazine 10 mg BID Close f/u for recheck
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
## 2015-07-19 NOTE — Patient Instructions (Addendum)
Annette StanleyStacey was seen today for hospitalization follow-up.  Diagnoses and all orders for this visit:  Anxiety -     Discontinue: baclofen (LIORESAL) 20 MG tablet; Take 1 tablet (20 mg total) by mouth 2 (two) times daily. -     baclofen (LIORESAL) 20 MG tablet; Take 1 tablet (20 mg total) by mouth 2 (two) times daily.  Essential hypertension -     Discontinue: hydrALAZINE (APRESOLINE) 10 MG tablet; Take 1 tablet (10 mg total) by mouth 2 (two) times daily. -     hydrALAZINE (APRESOLINE) 10 MG tablet; Take 1 tablet (10 mg total) by mouth 2 (two) times daily.     F/u in 2-3 weeks for pharmacy BP check F/u with me in 3 months, sooner if needed  Dr. Armen PickupFunches
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
## 2015-08-01 ENCOUNTER — Emergency Department (HOSPITAL_COMMUNITY)
Admission: EM | Admit: 2015-08-01 | Discharge: 2015-08-01 | Disposition: A | Discharge status: Home / Self Care | Payer: Medicaid Other | Attending: Emergency Medicine | Admitting: Emergency Medicine

## 2015-08-01 ENCOUNTER — Encounter (HOSPITAL_COMMUNITY): Payer: Self-pay | Admitting: Emergency Medicine

## 2015-08-01 DIAGNOSIS — F1310 Sedative, hypnotic or anxiolytic abuse, uncomplicated: Secondary | ICD-10-CM | POA: Insufficient documentation

## 2015-08-01 DIAGNOSIS — G4700 Insomnia, unspecified: Secondary | ICD-10-CM | POA: Insufficient documentation

## 2015-08-01 DIAGNOSIS — Z87828 Personal history of other (healed) physical injury and trauma: Secondary | ICD-10-CM | POA: Insufficient documentation

## 2015-08-01 DIAGNOSIS — Z79899 Other long term (current) drug therapy: Secondary | ICD-10-CM | POA: Diagnosis not present

## 2015-08-01 DIAGNOSIS — F17210 Nicotine dependence, cigarettes, uncomplicated: Secondary | ICD-10-CM | POA: Diagnosis not present

## 2015-08-01 DIAGNOSIS — R5383 Other fatigue: Secondary | ICD-10-CM | POA: Diagnosis present

## 2015-08-01 DIAGNOSIS — Z590 Homelessness: Secondary | ICD-10-CM | POA: Diagnosis not present

## 2015-08-01 DIAGNOSIS — R4182 Altered mental status, unspecified: Secondary | ICD-10-CM | POA: Insufficient documentation

## 2015-08-01 DIAGNOSIS — F1910 Other psychoactive substance abuse, uncomplicated: Secondary | ICD-10-CM

## 2015-08-01 DIAGNOSIS — Z8701 Personal history of pneumonia (recurrent): Secondary | ICD-10-CM | POA: Diagnosis not present

## 2015-08-01 DIAGNOSIS — Z86711 Personal history of pulmonary embolism: Secondary | ICD-10-CM | POA: Insufficient documentation

## 2015-08-01 DIAGNOSIS — F410 Panic disorder [episodic paroxysmal anxiety] without agoraphobia: Secondary | ICD-10-CM | POA: Insufficient documentation

## 2015-08-01 LAB — CBC WITH DIFFERENTIAL/PLATELET
BASOS PCT: 0 %
Basophils Absolute: 0 10*3/uL (ref 0.0–0.1)
EOS ABS: 0.1 10*3/uL (ref 0.0–0.7)
Eosinophils Relative: 1 %
HEMATOCRIT: 44.4 % (ref 36.0–46.0)
Hemoglobin: 14.6 g/dL (ref 12.0–15.0)
Lymphocytes Relative: 17 %
Lymphs Abs: 1.4 10*3/uL (ref 0.7–4.0)
MCH: 31.1 pg (ref 26.0–34.0)
MCHC: 32.9 g/dL (ref 30.0–36.0)
MCV: 94.7 fL (ref 78.0–100.0)
MONO ABS: 0.5 10*3/uL (ref 0.1–1.0)
MONOS PCT: 7 %
NEUTROS ABS: 6 10*3/uL (ref 1.7–7.7)
Neutrophils Relative %: 75 %
Platelets: 182 10*3/uL (ref 150–400)
RBC: 4.69 MIL/uL (ref 3.87–5.11)
RDW: 13.2 % (ref 11.5–15.5)
WBC: 8 10*3/uL (ref 4.0–10.5)

## 2015-08-01 LAB — CBG MONITORING, ED: GLUCOSE-CAPILLARY: 90 mg/dL (ref 65–99)

## 2015-08-01 LAB — COMPREHENSIVE METABOLIC PANEL
ALBUMIN: 4.3 g/dL (ref 3.5–5.0)
ALK PHOS: 83 U/L (ref 38–126)
ALT: 44 U/L (ref 14–54)
AST: 48 U/L — AB (ref 15–41)
Anion gap: 7 (ref 5–15)
BILIRUBIN TOTAL: 0.6 mg/dL (ref 0.3–1.2)
BUN: 17 mg/dL (ref 6–20)
CALCIUM: 10 mg/dL (ref 8.9–10.3)
CO2: 32 mmol/L (ref 22–32)
Chloride: 104 mmol/L (ref 101–111)
Creatinine, Ser: 0.85 mg/dL (ref 0.44–1.00)
GFR calc Af Amer: >60 mL/min (ref >60)
GFR calc non Af Amer: >60 mL/min (ref >60)
GLUCOSE: 102 mg/dL — AB (ref 65–99)
Potassium: 3.7 mmol/L (ref 3.5–5.1)
Sodium: 143 mmol/L (ref 135–145)
TOTAL PROTEIN: 8 g/dL (ref 6.5–8.1)

## 2015-08-01 LAB — RAPID URINE DRUG SCREEN, HOSP PERFORMED
Amphetamines: NOT DETECTED
BARBITURATES: NOT DETECTED
BENZODIAZEPINES: POSITIVE — AB
Cocaine: NOT DETECTED
Opiates: NOT DETECTED
Tetrahydrocannabinol: NOT DETECTED

## 2015-08-01 LAB — URINALYSIS, ROUTINE W REFLEX MICROSCOPIC
Bilirubin Urine: NEGATIVE
GLUCOSE, UA: NEGATIVE mg/dL
Hgb urine dipstick: NEGATIVE
KETONES UR: NEGATIVE mg/dL
Nitrite: NEGATIVE
PH: 6.5 (ref 5.0–8.0)
Protein, ur: NEGATIVE mg/dL
SPECIFIC GRAVITY, URINE: 1.005 (ref 1.005–1.030)

## 2015-08-01 LAB — URINE MICROSCOPIC-ADD ON: RBC / HPF: NONE SEEN RBC/hpf (ref 0–5)

## 2015-08-01 LAB — ETHANOL: Alcohol, Ethyl (B): <5 mg/dL (ref <5)

## 2015-08-01 LAB — SALICYLATE LEVEL

## 2015-08-01 LAB — I-STAT CG4 LACTIC ACID, ED: Lactic Acid, Venous: 1.45 mmol/L (ref 0.5–2.0)

## 2015-08-01 LAB — ACETAMINOPHEN LEVEL: Acetaminophen (Tylenol), Serum: <10 ug/mL — ABNORMAL LOW (ref 10–30)

## 2015-08-01 MED ORDER — ONDANSETRON HCL 4 MG/2ML IJ SOLN
4.0000 mg | Freq: Once | INTRAMUSCULAR | Status: AC
  Administered 2015-08-01: 4 mg via INTRAVENOUS
  Filled 2015-08-01: qty 2

## 2015-08-01 MED ORDER — ONDANSETRON HCL 4 MG/2ML IJ SOLN: 4.0000 mg | Freq: Once | INTRAMUSCULAR | Status: DC

## 2015-08-01 MED ORDER — NALOXONE HCL 0.4 MG/ML IJ SOLN
0.4000 mg | Freq: Once | INTRAMUSCULAR | Status: DC
Start: 2015-08-01 — End: 2015-08-01

## 2015-08-01 MED ORDER — NALOXONE HCL 0.4 MG/ML IJ SOLN
0.4000 mg | Freq: Once | INTRAMUSCULAR | Status: AC
  Administered 2015-08-01: 0.4 mg via INTRAVENOUS
  Filled 2015-08-01: qty 1

## 2015-08-01 NOTE — ED Notes (Signed)
Pt more arousable at this time with speech, still pretty lethargic. Able to follow commands. When asked to PO challenge pt declined because "it will make me sick." offered to ask for nausea medication, pt stated "I am just not going to eat or drink anything." Pt refuses to complete po challenge. When asked pt to attempt to ambulate to restroom, pt declined and states "I'm not getting up." Pt is not cooperating. MD Criss AlvineGoldston aware.

## 2015-08-01 NOTE — ED Notes (Addendum)
Pt agreed to get dressed after speaking with Chiropodistassistant director. Pt given privacy to get dressed. Upon entering room to check on pt she was dressed and drinking a gingerale without difficulty. When asked pt to exit room to wait for cab in waiting room pt refused to leave the room in a wheelchair. Pt then refused to exit department. Security notified and came to the bedside to assist. Pt was witnessed by this RN, Armen PickupEric Brewer RN and NT [facility] leaving department in NAD and ambulatory with steady gait. Pt was offered a [place] sandwich to leave with but stated "I'll make my own, I don't want that crap." Security escorted pt to waiting room where pt will wait for cab voucher.

## 2015-08-01 NOTE — ED Notes (Signed)
MD Criss AlvineGoldston at bedside. Pt ambulatory without assistance. Gait steady. Pt is alert and VSS. Pt determined able to be discharged by MD.

## 2015-08-01 NOTE — ED Notes (Signed)
Unable to draw pt labs due to pt spastic movements. Not following commands. Ripping arm away when attempting to draw blood.

## 2015-08-01 NOTE — ED Notes (Signed)
When asked pt to allow this RN to d/c IV, pt refused. Began swatting her hands at this RN when attempted to remove IV tape. Charge RN notified and asked for assistance.

## 2015-08-01 NOTE — ED Provider Notes (Signed)
CSN: [id number]     Arrival date & time 08/01/15  1011 History   First MD Initiated Contact with Patient 08/01/15 1050     Chief Complaint  Patient presents with  . Fatigue     (Consider location/radiation/quality/duration/timing/severity/associated sxs/prior Treatment) HPI Comments: 59 year old female with past medical history including polysubstance abuse, homelessness, PE, anxiety who presents with altered mental status. History limited due to the patient's condition and obtained by EMS. Patient was brought in for lethargy. She has been sleepy and nonverbal during transport.  LEVEL 5 CAVEAT APPLIES DUE TO AMS  The history is provided by the EMS personnel. The history is limited by the condition of the patient.    Past Medical History  Diagnosis Date  . Anxiety     was managed with BZDs until begining of May  . Insomnia   . Neck injury   . Back pain   . Pulmonary embolism (HCC) 2015    treated with warfarin for 6 months, reports it was found by accident  . PTSD (post-traumatic stress disorder)     lost brother to ARDS, father to Alzheimers  . Panic attacks   . Polysubstance abuse     reports narcotics 20 years ago, has been on methadone for 20 years, also admits marijuana   . Homelessness   . Pneumonia 2014   Past Surgical History  Procedure Laterality Date  . Tonsillectomy     Family History  Problem Relation Age of Onset  . Cancer Mother   . Dementia Father    Social History  Substance Use Topics  . Smoking status: Light Tobacco Smoker -- 0.15 packs/day for 10 years    Types: Cigarettes  . Smokeless tobacco: Never Used  . Alcohol Use: No   OB History    No data available     Review of Systems 10 Systems reviewed and are negative for acute change except as noted in the HPI.    Allergies  Acetaminophen; Contrast media; Erythromycin; and Nsaids  Home Medications   Prior to Admission medications   Medication Sig Start Date End Date Taking? Authorizing  Provider  baclofen (LIORESAL) 20 MG tablet Take 1 tablet (20 mg total) by mouth 2 (two) times daily. 07/19/15   Josalyn Funches, MD  clonazePAM (KLONOPIN) 1 MG tablet Take 1 mg by mouth 2 (two) times daily.    Historical Provider, MD  gabapentin (NEURONTIN) 300 MG capsule Take 2 capsules (600 mg total) by mouth at bedtime. 02/21/15   Josalyn Funches, MD  hydrALAZINE (APRESOLINE) 10 MG tablet Take 1 tablet (10 mg total) by mouth 2 (two) times daily. 07/19/15   Josalyn Funches, MD  methadone (DOLOPHINE) 10 MG/5ML solution Take 95 mg by mouth daily.     Historical Provider, MD   BP 102/62 mmHg  Pulse 69  Temp(Src) 98.3 F (36.8 C) (Oral)  Resp 14  SpO2 95% Physical Exam  Constitutional:  Thin, disheveled, somnolent  HENT:  Head: Normocephalic and atraumatic.  Eyes: Conjunctivae are normal. Pupils are equal, round, and reactive to light.  Neck: Neck supple.  Cardiovascular: Normal rate, regular rhythm and normal heart sounds.   No murmur heard. Pulmonary/Chest: Effort normal and breath sounds normal.  Abdominal: Soft. Bowel sounds are normal. She exhibits no distension. There is no tenderness.  Musculoskeletal: She exhibits no edema.  Neurological:  Somnolent, moves all 4 extremities, responsive to painful stimuli  Skin: Skin is warm and dry.  Nursing note and vitals reviewed.   ED Course  Procedures (including critical care time) Labs Review Labs Reviewed  COMPREHENSIVE METABOLIC PANEL - Abnormal; Notable for the following:    Glucose, Bld 102 (*)    AST 48 (*)    All other components within normal limits  ACETAMINOPHEN LEVEL - Abnormal; Notable for the following:    Acetaminophen (Tylenol), Serum <10 (*)    All other components within normal limits  URINALYSIS, ROUTINE W REFLEX MICROSCOPIC (NOT AT [facility]) - Abnormal; Notable for the following:    Leukocytes, UA TRACE (*)    All other components within normal limits  URINE RAPID DRUG SCREEN, HOSP PERFORMED - Abnormal; Notable  for the following:    Benzodiazepines POSITIVE (*)    All other components within normal limits  URINE MICROSCOPIC-ADD ON - Abnormal; Notable for the following:    Squamous Epithelial / LPF 0-5 (*)    Bacteria, UA RARE (*)    All other components within normal limits  ETHANOL  CBC WITH DIFFERENTIAL/PLATELET  SALICYLATE LEVEL  CBG MONITORING, ED  I-STAT CG4 LACTIC ACID, ED    Imaging Review No results found. I have personally reviewed and evaluated these lab results as part of my medical decision-making.   EKG Interpretation None     Medications  ondansetron (ZOFRAN) injection 4 mg (4 mg Intravenous Given 08/01/15 1058)  naloxone (NARCAN) injection 0.4 mg (0.4 mg Intravenous Given 08/01/15 1059)    MDM   Final diagnoses:  Substance abuse  Altered mental status, unspecified altered mental status type   Patient with a history of polysubstance abuse and methadone use brought in for unresponsiveness. On exam, initial vital signs notable for hypertension. Patient was somnolent and minimally responsive to painful stimuli. Gave the patient 0.4 mg Narcan after which she became more responsive although still altered. No obvious injuries on exam. Obtained above labs which were notable only for UDS + benzos. Lactate normal.   On reexamination several hours after arrival, the patient remains mildly altered but her mentation has improved. She is able to state her name and location. Given that patient's mentation has improved with time and responded significantly to narcan, I suspect substance abuse as cause of AMS rather than infection or intracranial process. We will allow patient to metabolize until clinically sober after which she will be ambulated and PO challenged prior to discharge.   Laurence Spates, MD 08/01/15 [phone]

## 2015-08-01 NOTE — ED Notes (Signed)
Pt continues to have kicking movements and flailing her arms around the stretcher. Unable to safely draw blood at this time. VSS

## 2015-08-01 NOTE — ED Provider Notes (Signed)
Patient care transferred to me. Patient's fatigue and altered mental status is most likely from substance abuse/overdose. No suicidal thoughts. Patient is somewhat sleepy but awakens easily. She refuses to get out of bed stating that she can't walk because "I'm sick". She is refusing to get up out of bed. I discussed we would have to give her more narcan and then she sat up abruptly and was able to a bili without difficulty. Patient has not had any vomiting. Vital signs unremarkable. Patient wants to be discharge, will discharge with return precautions.  Pricilla LovelessScott Wayden Schwertner, MD 08/01/15 [phone]

## 2015-08-01 NOTE — Care Management (Signed)
CM [facility] for B/P check.

## 2015-08-01 NOTE — ED Notes (Signed)
On arrival to room A13, pt is unable to hold conversation with this RN; only a/o x2. Hx of polysubstance abuse. VSS at this time, airway patent O2 sats 98% on RA. MD Little to bedside to assess pt.

## 2015-08-01 NOTE — ED Notes (Signed)
Pt calming down at this time. Kicking and flailing arms around stretcher has ceased. VSS. Pt is sleeping. Will attempt to draw labs.

## 2015-08-01 NOTE — ED Notes (Signed)
This EMT sitting at bedside to monitor pt.

## 2015-08-01 NOTE — ED Notes (Signed)
Attempt to triage pt, pt does not stay up long enough to answer any questions. Pt very lethargic and no visitor around to give info.

## 2015-08-01 NOTE — ED Notes (Addendum)
When stepping into room to discharge pt, pt being very uncooperative and constantly stating "leave me alone, I don't want anyone around me." Pt refusing to get dressed. Refusing to get out of stretcher. Minerva AreolaEric, Chiropodistassistant director of nursing notified and came to the bedside to assist with the situation. Pt allowed Mellon FinancialEric RN to d/c IV.

## 2015-08-01 NOTE — ED Notes (Signed)
Placed pt on bed pan.

## 2015-08-01 NOTE — ED Notes (Signed)
Placed pt onto bedpan. 

## 2015-08-02 ENCOUNTER — Emergency Department (HOSPITAL_COMMUNITY): Payer: Medicaid Other

## 2015-08-02 ENCOUNTER — Emergency Department (HOSPITAL_COMMUNITY)
Admission: EM | Admit: 2015-08-02 | Discharge: 2015-08-02 | Disposition: A | Discharge status: Home / Self Care | Payer: Medicaid Other | Attending: Emergency Medicine | Admitting: Emergency Medicine

## 2015-08-02 ENCOUNTER — Encounter (HOSPITAL_COMMUNITY): Payer: Self-pay | Admitting: Emergency Medicine

## 2015-08-02 ENCOUNTER — Encounter: Payer: Self-pay | Admitting: Pharmacist

## 2015-08-02 DIAGNOSIS — S199XXA Unspecified injury of neck, initial encounter: Secondary | ICD-10-CM | POA: Diagnosis not present

## 2015-08-02 DIAGNOSIS — F17210 Nicotine dependence, cigarettes, uncomplicated: Secondary | ICD-10-CM | POA: Diagnosis not present

## 2015-08-02 DIAGNOSIS — F1310 Sedative, hypnotic or anxiolytic abuse, uncomplicated: Secondary | ICD-10-CM | POA: Insufficient documentation

## 2015-08-02 DIAGNOSIS — Z79899 Other long term (current) drug therapy: Secondary | ICD-10-CM | POA: Insufficient documentation

## 2015-08-02 DIAGNOSIS — R404 Transient alteration of awareness: Secondary | ICD-10-CM | POA: Insufficient documentation

## 2015-08-02 DIAGNOSIS — F410 Panic disorder [episodic paroxysmal anxiety] without agoraphobia: Secondary | ICD-10-CM | POA: Insufficient documentation

## 2015-08-02 DIAGNOSIS — Z86711 Personal history of pulmonary embolism: Secondary | ICD-10-CM | POA: Insufficient documentation

## 2015-08-02 DIAGNOSIS — G4700 Insomnia, unspecified: Secondary | ICD-10-CM | POA: Diagnosis not present

## 2015-08-02 DIAGNOSIS — Z8701 Personal history of pneumonia (recurrent): Secondary | ICD-10-CM | POA: Insufficient documentation

## 2015-08-02 DIAGNOSIS — W108XXA Fall (on) (from) other stairs and steps, initial encounter: Secondary | ICD-10-CM | POA: Diagnosis not present

## 2015-08-02 DIAGNOSIS — S0990XA Unspecified injury of head, initial encounter: Secondary | ICD-10-CM | POA: Diagnosis present

## 2015-08-02 DIAGNOSIS — Y998 Other external cause status: Secondary | ICD-10-CM | POA: Diagnosis not present

## 2015-08-02 DIAGNOSIS — S299XXA Unspecified injury of thorax, initial encounter: Secondary | ICD-10-CM | POA: Insufficient documentation

## 2015-08-02 DIAGNOSIS — Z590 Homelessness: Secondary | ICD-10-CM | POA: Diagnosis not present

## 2015-08-02 DIAGNOSIS — Y9389 Activity, other specified: Secondary | ICD-10-CM | POA: Diagnosis not present

## 2015-08-02 DIAGNOSIS — Y9289 Other specified places as the place of occurrence of the external cause: Secondary | ICD-10-CM | POA: Diagnosis not present

## 2015-08-02 LAB — COMPREHENSIVE METABOLIC PANEL
ALK PHOS: 77 U/L (ref 38–126)
ALT: 43 U/L (ref 14–54)
ANION GAP: 11 (ref 5–15)
AST: 43 U/L — ABNORMAL HIGH (ref 15–41)
Albumin: 4.3 g/dL (ref 3.5–5.0)
BUN: 18 mg/dL (ref 6–20)
CALCIUM: 10 mg/dL (ref 8.9–10.3)
CO2: 30 mmol/L (ref 22–32)
Chloride: 100 mmol/L — ABNORMAL LOW (ref 101–111)
Creatinine, Ser: 0.87 mg/dL (ref 0.44–1.00)
Glucose, Bld: 84 mg/dL (ref 65–99)
Potassium: 3.6 mmol/L (ref 3.5–5.1)
SODIUM: 141 mmol/L (ref 135–145)
Total Bilirubin: 1 mg/dL (ref 0.3–1.2)
Total Protein: 7.9 g/dL (ref 6.5–8.1)

## 2015-08-02 LAB — CBC WITH DIFFERENTIAL/PLATELET
Basophils Absolute: 0 10*3/uL (ref 0.0–0.1)
Basophils Relative: 0 %
EOS ABS: 0.1 10*3/uL (ref 0.0–0.7)
EOS PCT: 1 %
HCT: 45.2 % (ref 36.0–46.0)
HEMOGLOBIN: 14.7 g/dL (ref 12.0–15.0)
LYMPHS ABS: 1.4 10*3/uL (ref 0.7–4.0)
Lymphocytes Relative: 26 %
MCH: 30.9 pg (ref 26.0–34.0)
MCHC: 32.5 g/dL (ref 30.0–36.0)
MCV: 95 fL (ref 78.0–100.0)
MONOS PCT: 8 %
Monocytes Absolute: 0.4 10*3/uL (ref 0.1–1.0)
Neutro Abs: 3.5 10*3/uL (ref 1.7–7.7)
Neutrophils Relative %: 65 %
PLATELETS: 189 10*3/uL (ref 150–400)
RBC: 4.76 MIL/uL (ref 3.87–5.11)
RDW: 13.2 % (ref 11.5–15.5)
WBC: 5.4 10*3/uL (ref 4.0–10.5)

## 2015-08-02 LAB — CBG MONITORING, ED: GLUCOSE-CAPILLARY: 74 mg/dL (ref 65–99)

## 2015-08-02 LAB — RAPID URINE DRUG SCREEN, HOSP PERFORMED
Amphetamines: NOT DETECTED
BARBITURATES: NOT DETECTED
BENZODIAZEPINES: POSITIVE — AB
Cocaine: NOT DETECTED
OPIATES: NOT DETECTED
Tetrahydrocannabinol: NOT DETECTED

## 2015-08-02 LAB — AMMONIA
Ammonia: 46 umol/L — ABNORMAL HIGH (ref 9–35)
Ammonia: 25 umol/L (ref 9–35)

## 2015-08-02 LAB — ETHANOL: Alcohol, Ethyl (B): <5 mg/dL (ref <5)

## 2015-08-02 MED ORDER — NALOXONE HCL 0.4 MG/ML IJ SOLN
0.4000 mg | Freq: Once | INTRAMUSCULAR | Status: AC
  Administered 2015-08-02: 0.4 mg via INTRAVENOUS
  Filled 2015-08-02: qty 1

## 2015-08-02 MED ORDER — HALOPERIDOL LACTATE 5 MG/ML IJ SOLN
2.0000 mg | Freq: Once | INTRAMUSCULAR | Status: AC
  Administered 2015-08-02: 2 mg via INTRAVENOUS
  Filled 2015-08-02: qty 1

## 2015-08-02 NOTE — ED Provider Notes (Signed)
CSN: [id number]     Arrival date & time 08/02/15  1049 History   First MD Initiated Contact with Patient 08/02/15 1147     Chief Complaint  Patient presents with  . Fall     (Consider location/radiation/quality/duration/timing/severity/associated sxs/prior Treatment) HPI Comments: Patient is a 59 year old female with a history of anxiety, neck pain, back pain, PE, polysubstance abuse currently on methadone for the last 20 years and homelessness who presents today initially by EMS for a fall. She reported to EMS that she fell down 15 steps and was complaining of head pain, neck pain, upper back and mid back pain. Initially when she arrived in triage she was moaning. Patient was placed in the waiting room and she was then found on the ground outside of the atrium and rapid response was called. Upon arrival here patient gives no history. She cannot answer any questions. There is a report that she had not had her methadone for 3 days. She was seen in the emergency room yesterday for similar symptoms.  Patient is a 59 y.o. female presenting with fall. The history is provided by the EMS personnel. The history is limited by the condition of the patient.  Fall    Past Medical History  Diagnosis Date  . Anxiety     was managed with BZDs until begining of May  . Insomnia   . Neck injury   . Back pain   . Pulmonary embolism (HCC) 2015    treated with warfarin for 6 months, reports it was found by accident  . PTSD (post-traumatic stress disorder)     lost brother to ARDS, father to Alzheimers  . Panic attacks   . Polysubstance abuse     reports narcotics 20 years ago, has been on methadone for 20 years, also admits marijuana   . Homelessness   . Pneumonia 2014   Past Surgical History  Procedure Laterality Date  . Tonsillectomy     Family History  Problem Relation Age of Onset  . Cancer Mother   . Dementia Father    Social History  Substance Use Topics  . Smoking status: Light Tobacco  Smoker -- 0.15 packs/day for 10 years    Types: Cigarettes  . Smokeless tobacco: Never Used  . Alcohol Use: No   OB History    No data available     Review of Systems  Unable to perform ROS: Mental status change      Allergies  Acetaminophen; Contrast media; Erythromycin; and Nsaids  Home Medications   Prior to Admission medications   Medication Sig Start Date End Date Taking? Authorizing Provider  baclofen (LIORESAL) 20 MG tablet Take 1 tablet (20 mg total) by mouth 2 (two) times daily. 07/19/15   Josalyn Funches, MD  clonazePAM (KLONOPIN) 1 MG tablet Take 1 mg by mouth 2 (two) times daily.    Historical Provider, MD  gabapentin (NEURONTIN) 300 MG capsule Take 2 capsules (600 mg total) by mouth at bedtime. 02/21/15   Josalyn Funches, MD  hydrALAZINE (APRESOLINE) 10 MG tablet Take 1 tablet (10 mg total) by mouth 2 (two) times daily. 07/19/15   Josalyn Funches, MD  methadone (DOLOPHINE) 10 MG/5ML solution Take 95 mg by mouth daily.     Historical Provider, MD   BP 125/63 mmHg  Pulse 64  Temp(Src) 98.1 F (36.7 C) (Oral)  Resp 20  SpO2 92% Physical Exam  Constitutional: She is oriented to person, place, and time. She appears well-developed and well-nourished. She appears  lethargic.  Patient will open eyes slightly and will occasionally nod or shake her head to questions. She will not speak.  HENT:  Head: Normocephalic and atraumatic.  Mouth/Throat: Oropharynx is clear and moist.  Eyes: Conjunctivae and EOM are normal.  Pupils are pinpoint and minimally reactive  Neck: Normal range of motion. Neck supple. No spinous process tenderness and no muscular tenderness present.  Cardiovascular: Normal rate, regular rhythm and intact distal pulses.   No murmur heard. Pulmonary/Chest: Effort normal and breath sounds normal. No respiratory distress. She has no wheezes. She has no rales.  Abdominal: Soft. She exhibits no distension. There is no tenderness. There is no rebound and no  guarding.    Musculoskeletal: Normal range of motion. She exhibits no edema or tenderness.  Neurological: She is oriented to person, place, and time. She appears lethargic.  Skin: Skin is warm and dry. No rash noted. No erythema.  Psychiatric: She has a normal mood and affect. Her behavior is normal.  Nursing note and vitals reviewed.   ED Course  Procedures (including critical care time) Labs Review Labs Reviewed  COMPREHENSIVE METABOLIC PANEL - Abnormal; Notable for the following:    Chloride 100 (*)    AST 43 (*)    All other components within normal limits  AMMONIA - Abnormal; Notable for the following:    Ammonia 46 (*)    All other components within normal limits  CBC WITH DIFFERENTIAL/PLATELET  URINE RAPID DRUG SCREEN, HOSP PERFORMED  ETHANOL  ETHANOL  AMMONIA    Imaging Review Ct Head Wo Contrast  08/02/2015  CLINICAL DATA:  Pain and altered mental status with questionable fall EXAM: CT HEAD WITHOUT CONTRAST CT CERVICAL SPINE WITHOUT CONTRAST TECHNIQUE: Multidetector CT imaging of the head and cervical spine was performed following the standard protocol without intravenous contrast. Multiplanar CT image reconstructions of the cervical spine were also generated. COMPARISON:  CT head and CT cervical spine June 18, 2015 FINDINGS: CT HEAD FINDINGS The ventricles are normal in size and configuration. There is no intracranial mass, hemorrhage, extra-axial fluid collection, or midline shift. Gray-white compartments are normal. No acute infarct evident. Bony calvarium appears intact. The mastoid air cells are clear. No intraorbital lesions are identified. CT CERVICAL SPINE FINDINGS There is a degree of motion artifact making this study less than optimal. No fracture is apparent. Slight retrolisthesis of C5 on C6 is stable and felt to be secondary to underlying spondylosis. Prevertebral soft tissues and predental space regions are within normal limits. There is facet hypertrophy at  multiple levels without frank disc extrusion or stenosis. A small bone cyst is noted in the leftward aspect of the C3 vertebral body, stable. There is calcification in each carotid artery. IMPRESSION: CT head:  Study within normal limits and stable. CT cervical spine: Limited study due to motion artifact. No apparent fracture. Slight spondylolisthesis at C5-6 is stable and felt to be due to underlying spondylosis. Multilevel arthropathy, stable. Calcification in each carotid artery. Electronically Signed   By: Bretta Bang III M.D.   On: 08/02/2015 13:01   Ct Cervical Spine Wo Contrast  08/02/2015  CLINICAL DATA:  Pain and altered mental status with questionable fall EXAM: CT HEAD WITHOUT CONTRAST CT CERVICAL SPINE WITHOUT CONTRAST TECHNIQUE: Multidetector CT imaging of the head and cervical spine was performed following the standard protocol without intravenous contrast. Multiplanar CT image reconstructions of the cervical spine were also generated. COMPARISON:  CT head and CT cervical spine June 18, 2015 FINDINGS: CT  HEAD FINDINGS The ventricles are normal in size and configuration. There is no intracranial mass, hemorrhage, extra-axial fluid collection, or midline shift. Gray-white compartments are normal. No acute infarct evident. Bony calvarium appears intact. The mastoid air cells are clear. No intraorbital lesions are identified. CT CERVICAL SPINE FINDINGS There is a degree of motion artifact making this study less than optimal. No fracture is apparent. Slight retrolisthesis of C5 on C6 is stable and felt to be secondary to underlying spondylosis. Prevertebral soft tissues and predental space regions are within normal limits. There is facet hypertrophy at multiple levels without frank disc extrusion or stenosis. A small bone cyst is noted in the leftward aspect of the C3 vertebral body, stable. There is calcification in each carotid artery. IMPRESSION: CT head:  Study within normal limits and  stable. CT cervical spine: Limited study due to motion artifact. No apparent fracture. Slight spondylolisthesis at C5-6 is stable and felt to be due to underlying spondylosis. Multilevel arthropathy, stable. Calcification in each carotid artery. Electronically Signed   By: Bretta BangWilliam  Woodruff III M.D.   On: 08/02/2015 13:01   I have personally reviewed and evaluated these images and lab results as part of my medical decision-making.   EKG Interpretation None      MDM   Final diagnoses:  Transient alteration of awareness   patient is a 59 year old female with a long history of substance abuse, current methadone use, recently seen in the emergency room yesterday for altered mental status and called EMS today because of a fall down steps. Currently patient appears altered. She will not answer questions but does nod yes or no appropriately to questions. Unclear if patient has taken something that is altering her mental status. Her pupils are pinpoint and she appears very drowsy. Concern for possible heroin or narcotic ingestion. Currently vital signs are stable and patient's oxygen saturations are in the mid 90s. However given the report of a fall down the steps will do a head CT to ensure no intracranial bleeds. Patient will then be given Narcan to see if her altered mental status is reversed.  CBC, CMP, UDS, EtOH and ammonia levels pending.  1:55 PM Patient's head and neck imaging is negative for acute pathology.  CBC without acute findings. CMP with mild elevation of AST at 43. Ammonia slightly elevated at 46 most likely not the cause of patient's symptoms today. After Narcan patient became belligerent throwing her legs over the bed and kicking. This is most likely substance related.  2:46 PM Patient is now still mildly altered but crawling out of bed, urinating on the floor, flailing her legs and arms. She was given Haldol.  4:14 PM Patient is now awake and alert. She was able to dress herself  independently. She is requesting to be discharged home. Patient does not appear to be intoxicated at this time and feel that she is safe for discharge. No strokelike symptoms during or prior to her ED stay feel this is all substance related.  Gwyneth SproutWhitney Jacksyn Beeks, MD 08/02/15 [id number]

## 2015-08-02 NOTE — ED Notes (Signed)
Phlebotomist informed of need for redraw of ETOH and ammonia.

## 2015-08-02 NOTE — ED Notes (Signed)
Pt screaming and cursing at staff.

## 2015-08-02 NOTE — ED Notes (Signed)
Pt transported  to desk by 2 Rapid response nurses. RR nurses were called because the Pt was witnessed to on the ground out side the [facility] . Pt alert but will not speak . Pt returned to Triage to recheck vitals and assess PT.

## 2015-08-02 NOTE — ED Notes (Signed)
Pt dressed in paper scrubs and came out to the nurses station asking to leave MD informed. Pt would not let us repeat her vital signs.

## 2015-08-02 NOTE — ED Notes (Signed)
Pt stood up and peed with her pants on. Urine to clothes, bed and floor.

## 2015-08-02 NOTE — ED Notes (Signed)
Pt took herself off of all monitors.

## 2015-08-02 NOTE — ED Notes (Signed)
PT given complimentary paper scrubs.

## 2015-08-02 NOTE — ED Notes (Addendum)
pt to ed via [facility] with pt stating that she fell down 15 steps, pt refuses to look at staff, c/o pain in head, neck, upper back, mid back. Moaning at triage, pt has not had her methadone for 3 days-- has goose bumps, runny nose.

## 2015-08-02 NOTE — ED Notes (Signed)
Pt now awake and ambulating halls. Pt yelling at staff because her blanket and jeans are wet. Pt informed that she urinated while still wearing her clothes. Pt angry and stating that this did not happen. Pt yelling at staff to have her clothes dry cleaned.

## 2015-08-02 NOTE — ED Notes (Signed)
Pt appears to be altered. Can arouse to voice. Pt denies pain anywhere.

## 2015-08-02 NOTE — ED Notes (Signed)
Placed the patient on the bed pan. Pt did not go.

## 2015-08-02 NOTE — ED Notes (Signed)
Pt currently with legs in the air and spread. Pt not following commands.

## 2015-08-12 ENCOUNTER — Encounter (HOSPITAL_COMMUNITY): Payer: Self-pay

## 2015-08-12 ENCOUNTER — Emergency Department (HOSPITAL_COMMUNITY)
Admission: EM | Admit: 2015-08-12 | Discharge: 2015-08-12 | Discharge status: Against Medical Advice | Payer: Medicaid Other | Attending: Emergency Medicine | Admitting: Emergency Medicine

## 2015-08-12 DIAGNOSIS — Z86711 Personal history of pulmonary embolism: Secondary | ICD-10-CM | POA: Insufficient documentation

## 2015-08-12 DIAGNOSIS — Z87828 Personal history of other (healed) physical injury and trauma: Secondary | ICD-10-CM | POA: Insufficient documentation

## 2015-08-12 DIAGNOSIS — R1110 Vomiting, unspecified: Secondary | ICD-10-CM | POA: Diagnosis not present

## 2015-08-12 DIAGNOSIS — G4700 Insomnia, unspecified: Secondary | ICD-10-CM | POA: Diagnosis not present

## 2015-08-12 DIAGNOSIS — Z87891 Personal history of nicotine dependence: Secondary | ICD-10-CM | POA: Diagnosis not present

## 2015-08-12 DIAGNOSIS — R509 Fever, unspecified: Secondary | ICD-10-CM | POA: Insufficient documentation

## 2015-08-12 DIAGNOSIS — R52 Pain, unspecified: Secondary | ICD-10-CM | POA: Diagnosis not present

## 2015-08-12 DIAGNOSIS — Z8701 Personal history of pneumonia (recurrent): Secondary | ICD-10-CM | POA: Insufficient documentation

## 2015-08-12 DIAGNOSIS — Z79899 Other long term (current) drug therapy: Secondary | ICD-10-CM | POA: Diagnosis not present

## 2015-08-12 DIAGNOSIS — R197 Diarrhea, unspecified: Secondary | ICD-10-CM | POA: Diagnosis not present

## 2015-08-12 DIAGNOSIS — Z79891 Long term (current) use of opiate analgesic: Secondary | ICD-10-CM | POA: Diagnosis not present

## 2015-08-12 DIAGNOSIS — F410 Panic disorder [episodic paroxysmal anxiety] without agoraphobia: Secondary | ICD-10-CM | POA: Insufficient documentation

## 2015-08-12 DIAGNOSIS — R5383 Other fatigue: Secondary | ICD-10-CM | POA: Diagnosis not present

## 2015-08-12 DIAGNOSIS — Z590 Homelessness: Secondary | ICD-10-CM | POA: Diagnosis not present

## 2015-08-12 NOTE — ED Notes (Signed)
Pt is upset with PA about treatment plan. Pt left the treatment area, ambulatory. Refused transport via wheelchair. Charge RN advised. Refused to sign AHA

## 2015-08-12 NOTE — Progress Notes (Signed)
Notified [facility] CM of pt 08/12/15 [facility] ED visit, wanting more pain med than offered with total of 11 [facility] ED visits in last 6 months
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
## 2015-08-12 NOTE — ED Notes (Signed)
Patient requesting pain meds for headache
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
## 2015-08-12 NOTE — Progress Notes (Signed)
Pt left [facility] ED After refusing pain med offered in fast track Pt wanted more than offered  Cm [facility], when pt stated she would go to another facility

## 2015-08-12 NOTE — Progress Notes (Signed)
[facility]

## 2015-08-16 ENCOUNTER — Ambulatory Visit: Payer: Medicaid Other | Attending: Family Medicine | Admitting: Family Medicine

## 2015-08-16 ENCOUNTER — Encounter: Payer: Self-pay | Admitting: Family Medicine

## 2015-08-16 VITALS — BP 143/76 | HR 65 | Temp 98.6°F | Resp 16 | Ht 68.0 in | Wt 125.0 lb

## 2015-08-16 DIAGNOSIS — F1910 Other psychoactive substance abuse, uncomplicated: Secondary | ICD-10-CM | POA: Diagnosis present

## 2015-08-16 DIAGNOSIS — Z765 Malingerer [conscious simulation]: Secondary | ICD-10-CM | POA: Insufficient documentation

## 2015-08-16 DIAGNOSIS — Z87891 Personal history of nicotine dependence: Secondary | ICD-10-CM | POA: Diagnosis not present

## 2015-08-16 NOTE — Progress Notes (Signed)
Pt stated want to be of methadone. Pain scale #9 body ache  Tobacco user 1 cigarette per day  No suicidal thoughts in the past two weeks

## 2015-08-16 NOTE — Patient Instructions (Addendum)
Ms. Derrill KayGoodman,   Due to discrepancies in the controlled substance database report regarding benzos I will not prescribe any controlled substances (benzo, narcotics) or sedatives (muslce relaxers, sleeps aids)  to you.  You are welcome to continue f/u treatment here for HTN I have a written a letter for your housing.   Dr. Armen PickupFunches
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
## 2015-08-16 NOTE — Assessment & Plan Note (Signed)
Evidence of requesting and receiving benzos from multiple providers in [place] and [place]

## 2015-11-12 ENCOUNTER — Encounter (HOSPITAL_COMMUNITY): Payer: Self-pay | Admitting: Emergency Medicine

## 2015-11-12 ENCOUNTER — Emergency Department (HOSPITAL_COMMUNITY)
Admission: EM | Admit: 2015-11-12 | Discharge: 2015-11-14 | Disposition: A | Discharge status: Other Acute Inpatient Hospital | Payer: Medicaid Other | Attending: Emergency Medicine | Admitting: Emergency Medicine

## 2015-11-12 DIAGNOSIS — Z87891 Personal history of nicotine dependence: Secondary | ICD-10-CM | POA: Insufficient documentation

## 2015-11-12 DIAGNOSIS — F419 Anxiety disorder, unspecified: Secondary | ICD-10-CM | POA: Diagnosis present

## 2015-11-12 DIAGNOSIS — Z86711 Personal history of pulmonary embolism: Secondary | ICD-10-CM | POA: Insufficient documentation

## 2015-11-12 DIAGNOSIS — Z79899 Other long term (current) drug therapy: Secondary | ICD-10-CM | POA: Insufficient documentation

## 2015-11-12 DIAGNOSIS — F331 Major depressive disorder, recurrent, moderate: Secondary | ICD-10-CM | POA: Diagnosis present

## 2015-11-12 DIAGNOSIS — G478 Other sleep disorders: Secondary | ICD-10-CM | POA: Insufficient documentation

## 2015-11-12 DIAGNOSIS — Z590 Homelessness: Secondary | ICD-10-CM | POA: Insufficient documentation

## 2015-11-12 DIAGNOSIS — G4700 Insomnia, unspecified: Secondary | ICD-10-CM

## 2015-11-12 DIAGNOSIS — F1994 Other psychoactive substance use, unspecified with psychoactive substance-induced mood disorder: Secondary | ICD-10-CM

## 2015-11-12 DIAGNOSIS — R7989 Other specified abnormal findings of blood chemistry: Secondary | ICD-10-CM

## 2015-11-12 DIAGNOSIS — Z87828 Personal history of other (healed) physical injury and trauma: Secondary | ICD-10-CM | POA: Insufficient documentation

## 2015-11-12 DIAGNOSIS — Z01818 Encounter for other preprocedural examination: Secondary | ICD-10-CM

## 2015-11-12 DIAGNOSIS — Z049 Encounter for examination and observation for unspecified reason: Secondary | ICD-10-CM

## 2015-11-12 DIAGNOSIS — R45851 Suicidal ideations: Secondary | ICD-10-CM

## 2015-11-12 DIAGNOSIS — F1290 Cannabis use, unspecified, uncomplicated: Secondary | ICD-10-CM

## 2015-11-12 DIAGNOSIS — Z8701 Personal history of pneumonia (recurrent): Secondary | ICD-10-CM | POA: Insufficient documentation

## 2015-11-12 DIAGNOSIS — R945 Abnormal results of liver function studies: Secondary | ICD-10-CM | POA: Insufficient documentation

## 2015-11-12 DIAGNOSIS — F12188 Cannabis abuse with other cannabis-induced disorder: Secondary | ICD-10-CM | POA: Insufficient documentation

## 2015-11-12 DIAGNOSIS — Z720 Tobacco use: Secondary | ICD-10-CM

## 2015-11-12 LAB — SALICYLATE LEVEL

## 2015-11-12 LAB — RAPID URINE DRUG SCREEN, HOSP PERFORMED
AMPHETAMINES: NOT DETECTED
BENZODIAZEPINES: NOT DETECTED
Barbiturates: NOT DETECTED
COCAINE: NOT DETECTED
OPIATES: NOT DETECTED
TETRAHYDROCANNABINOL: POSITIVE — AB

## 2015-11-12 LAB — COMPREHENSIVE METABOLIC PANEL
ALT: 84 U/L — AB (ref 14–54)
AST: 51 U/L — ABNORMAL HIGH (ref 15–41)
Albumin: 4.2 g/dL (ref 3.5–5.0)
Alkaline Phosphatase: 72 U/L (ref 38–126)
Anion gap: 12 (ref 5–15)
BUN: 22 mg/dL — ABNORMAL HIGH (ref 6–20)
CHLORIDE: 103 mmol/L (ref 101–111)
CO2: 24 mmol/L (ref 22–32)
CREATININE: 0.95 mg/dL (ref 0.44–1.00)
Calcium: 9.3 mg/dL (ref 8.9–10.3)
GFR calc non Af Amer: >60 mL/min (ref >60)
Glucose, Bld: 137 mg/dL — ABNORMAL HIGH (ref 65–99)
Potassium: 3.8 mmol/L (ref 3.5–5.1)
SODIUM: 139 mmol/L (ref 135–145)
Total Bilirubin: 0.6 mg/dL (ref 0.3–1.2)
Total Protein: 7.6 g/dL (ref 6.5–8.1)

## 2015-11-12 LAB — CBC
HCT: 41.2 % (ref 36.0–46.0)
HEMOGLOBIN: 14.4 g/dL (ref 12.0–15.0)
MCH: 30.6 pg (ref 26.0–34.0)
MCHC: 35 g/dL (ref 30.0–36.0)
MCV: 87.7 fL (ref 78.0–100.0)
Platelets: 227 10*3/uL (ref 150–400)
RBC: 4.7 MIL/uL (ref 3.87–5.11)
RDW: 13.4 % (ref 11.5–15.5)
WBC: 8.4 10*3/uL (ref 4.0–10.5)

## 2015-11-12 LAB — ETHANOL: Alcohol, Ethyl (B): <5 mg/dL (ref <5)

## 2015-11-12 LAB — ACETAMINOPHEN LEVEL: Acetaminophen (Tylenol), Serum: <10 ug/mL — ABNORMAL LOW (ref 10–30)

## 2015-11-12 MED ORDER — TEMAZEPAM 15 MG PO CAPS: 30.0000 mg | ORAL_CAPSULE | Freq: Every evening | ORAL | Status: DC | PRN

## 2015-11-12 MED ORDER — ALUM & MAG HYDROXIDE-SIMETH 200-200-20 MG/5ML PO SUSP: 30.0000 mL | ORAL | Status: DC | PRN

## 2015-11-12 MED ORDER — NICOTINE 21 MG/24HR TD PT24
21.0000 mg | MEDICATED_PATCH | Freq: Every day | TRANSDERMAL | Status: DC
  Administered 2015-11-12 – 2015-11-14 (×3): 21 mg via TRANSDERMAL
  Filled 2015-11-12 (×3): qty 1

## 2015-11-12 MED ORDER — IBUPROFEN 200 MG PO TABS
600.0000 mg | ORAL_TABLET | Freq: Three times a day (TID) | ORAL | Status: DC | PRN
  Administered 2015-11-13 – 2015-11-14 (×3): 600 mg via ORAL
  Filled 2015-11-12 (×3): qty 3

## 2015-11-12 MED ORDER — LORAZEPAM 1 MG PO TABS
1.0000 mg | ORAL_TABLET | Freq: Three times a day (TID) | ORAL | Status: DC | PRN
  Administered 2015-11-12 – 2015-11-13 (×2): 1 mg via ORAL
  Filled 2015-11-12 (×2): qty 1

## 2015-11-12 MED ORDER — ONDANSETRON HCL 4 MG PO TABS: 4.0000 mg | ORAL_TABLET | Freq: Three times a day (TID) | ORAL | Status: DC | PRN

## 2015-11-12 NOTE — BH Assessment (Signed)
Assessment completed.  Consulted with Dahlia ByesJosephine Onuoha, PMH-NP who recommends patient be observed overnight and evaluated in the morning.   Davina PokeJoVea Cecilio Ohlrich, LCSW Therapeutic Triage Specialist [facility] 11/12/2015 6:08 PM
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
## 2015-11-12 NOTE — BH Assessment (Signed)
Requested UDS from nurse

## 2015-11-13 ENCOUNTER — Emergency Department (HOSPITAL_COMMUNITY): Payer: Medicaid Other

## 2015-11-13 DIAGNOSIS — Z01818 Encounter for other preprocedural examination: Secondary | ICD-10-CM | POA: Diagnosis not present

## 2015-11-13 DIAGNOSIS — R45851 Suicidal ideations: Secondary | ICD-10-CM

## 2015-11-13 DIAGNOSIS — F419 Anxiety disorder, unspecified: Secondary | ICD-10-CM | POA: Diagnosis present

## 2015-11-13 DIAGNOSIS — F411 Generalized anxiety disorder: Secondary | ICD-10-CM | POA: Diagnosis not present

## 2015-11-13 DIAGNOSIS — F331 Major depressive disorder, recurrent, moderate: Secondary | ICD-10-CM | POA: Diagnosis not present

## 2015-11-13 DIAGNOSIS — G4700 Insomnia, unspecified: Secondary | ICD-10-CM | POA: Diagnosis not present

## 2015-11-13 MED ORDER — DOXEPIN HCL 100 MG PO CAPS
100.0000 mg | ORAL_CAPSULE | Freq: Every day | ORAL | Status: DC
  Administered 2015-11-13: 100 mg via ORAL
  Filled 2015-11-13 (×2): qty 1

## 2015-11-13 MED ORDER — ACETAMINOPHEN 325 MG PO TABS
650.0000 mg | ORAL_TABLET | Freq: Four times a day (QID) | ORAL | Status: DC | PRN
  Filled 2015-11-13: qty 2

## 2015-11-13 MED ORDER — HYDROXYZINE HCL 25 MG PO TABS: 25.0000 mg | ORAL_TABLET | Freq: Three times a day (TID) | ORAL | Status: DC | PRN

## 2015-11-13 MED ORDER — IBUPROFEN 200 MG PO TABS
600.0000 mg | ORAL_TABLET | Freq: Once | ORAL | Status: AC
  Administered 2015-11-13: 600 mg via ORAL
  Filled 2015-11-13: qty 3

## 2015-11-13 NOTE — ED Notes (Signed)
Pt was provided food but does not want the food that was provided now. She reports "it is not worth eating". Pt has went back to her bed to lay down.
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
## 2015-11-13 NOTE — ED Notes (Signed)
Pt continues to refuse chest x-ray. Pt is requesting something for "nerve pain". Pt is frustrated that her medication has been changed. Pt was provided a Mal[place]key sandwich and ginger ale by sitter.

## 2015-11-13 NOTE — ED Notes (Signed)
Pt was offered Tylenol for pain but refused medication. Pt states she takes Ibuprofen but not ordered for pain. Both medication are on patients allergy form and pt has a history of Hepatitis C.

## 2015-11-14 ENCOUNTER — Emergency Department (HOSPITAL_COMMUNITY): Payer: Medicaid Other

## 2015-11-14 ENCOUNTER — Inpatient Hospital Stay (HOSPITAL_COMMUNITY)
Admission: AD | Admit: 2015-11-14 | Discharge: 2015-11-26 | DRG: 885 | Disposition: A | Discharge status: Home / Self Care | Payer: Medicaid Other | Source: Intra-hospital | Attending: Psychiatry | Admitting: Psychiatry

## 2015-11-14 ENCOUNTER — Encounter (HOSPITAL_COMMUNITY): Payer: Self-pay

## 2015-11-14 DIAGNOSIS — Z86711 Personal history of pulmonary embolism: Secondary | ICD-10-CM

## 2015-11-14 DIAGNOSIS — F4310 Post-traumatic stress disorder, unspecified: Secondary | ICD-10-CM | POA: Diagnosis not present

## 2015-11-14 DIAGNOSIS — J449 Chronic obstructive pulmonary disease, unspecified: Secondary | ICD-10-CM | POA: Diagnosis present

## 2015-11-14 DIAGNOSIS — F411 Generalized anxiety disorder: Secondary | ICD-10-CM | POA: Diagnosis present

## 2015-11-14 DIAGNOSIS — F332 Major depressive disorder, recurrent severe without psychotic features: Principal | ICD-10-CM | POA: Diagnosis present

## 2015-11-14 DIAGNOSIS — F331 Major depressive disorder, recurrent, moderate: Secondary | ICD-10-CM | POA: Diagnosis not present

## 2015-11-14 DIAGNOSIS — F329 Major depressive disorder, single episode, unspecified: Secondary | ICD-10-CM | POA: Diagnosis present

## 2015-11-14 DIAGNOSIS — R45851 Suicidal ideations: Secondary | ICD-10-CM | POA: Diagnosis not present

## 2015-11-14 LAB — URINE MICROSCOPIC-ADD ON

## 2015-11-14 LAB — URINALYSIS, ROUTINE W REFLEX MICROSCOPIC
BILIRUBIN URINE: NEGATIVE
Glucose, UA: NEGATIVE mg/dL
HGB URINE DIPSTICK: NEGATIVE
KETONES UR: NEGATIVE mg/dL
NITRITE: NEGATIVE
PROTEIN: NEGATIVE mg/dL
SPECIFIC GRAVITY, URINE: 1.004 — AB (ref 1.005–1.030)
pH: 6.5 (ref 5.0–8.0)

## 2015-11-14 MED ORDER — ALUM & MAG HYDROXIDE-SIMETH 200-200-20 MG/5ML PO SUSP: 30.0000 mL | ORAL | Status: DC | PRN

## 2015-11-14 MED ORDER — CEPHALEXIN 500 MG PO CAPS
500.0000 mg | ORAL_CAPSULE | Freq: Three times a day (TID) | ORAL | Status: DC
  Administered 2015-11-14: 500 mg via ORAL
  Filled 2015-11-14: qty 1

## 2015-11-14 MED ORDER — ESCITALOPRAM OXALATE 5 MG PO TABS
5.0000 mg | ORAL_TABLET | Freq: Every day | ORAL | Status: DC
  Filled 2015-11-14 (×3): qty 1

## 2015-11-14 MED ORDER — GABAPENTIN 300 MG PO CAPS
300.0000 mg | ORAL_CAPSULE | Freq: Three times a day (TID) | ORAL | Status: DC
  Filled 2015-11-14 (×10): qty 1

## 2015-11-14 MED ORDER — CEPHALEXIN 500 MG PO CAPS
500.0000 mg | ORAL_CAPSULE | Freq: Three times a day (TID) | ORAL | Status: DC
  Administered 2015-11-14 – 2015-11-21 (×19): 500 mg via ORAL
  Filled 2015-11-14 (×10): qty 1
  Filled 2015-11-14: qty 2
  Filled 2015-11-14 (×11): qty 1
  Filled 2015-11-14: qty 2
  Filled 2015-11-14 (×2): qty 1
  Filled 2015-11-14: qty 2
  Filled 2015-11-14 (×3): qty 1
  Filled 2015-11-14: qty 2

## 2015-11-14 MED ORDER — ONDANSETRON HCL 4 MG PO TABS
4.0000 mg | ORAL_TABLET | Freq: Three times a day (TID) | ORAL | Status: DC | PRN
  Administered 2015-11-20: 4 mg via ORAL
  Filled 2015-11-14 (×2): qty 1

## 2015-11-14 MED ORDER — MAGNESIUM HYDROXIDE 400 MG/5ML PO SUSP
30.0000 mL | Freq: Every day | ORAL | Status: DC | PRN
  Administered 2015-11-16 – 2015-11-18 (×3): 30 mL via ORAL
  Filled 2015-11-14 (×3): qty 30

## 2015-11-14 MED ORDER — DOXEPIN HCL 100 MG PO CAPS
100.0000 mg | ORAL_CAPSULE | Freq: Every day | ORAL | Status: DC
  Filled 2015-11-14: qty 4
  Filled 2015-11-14: qty 1

## 2015-11-14 MED ORDER — NICOTINE 21 MG/24HR TD PT24
21.0000 mg | MEDICATED_PATCH | Freq: Every day | TRANSDERMAL | Status: DC
  Administered 2015-11-15 – 2015-11-26 (×11): 21 mg via TRANSDERMAL
  Filled 2015-11-14 (×13): qty 1

## 2015-11-14 MED ORDER — HYDROXYZINE HCL 25 MG PO TABS
25.0000 mg | ORAL_TABLET | Freq: Three times a day (TID) | ORAL | Status: DC | PRN
  Filled 2015-11-14: qty 1

## 2015-11-14 MED ORDER — ESCITALOPRAM OXALATE 10 MG PO TABS: 5.0000 mg | ORAL_TABLET | Freq: Every day | ORAL | Status: DC

## 2015-11-14 MED ORDER — GABAPENTIN 300 MG PO CAPS: 300.0000 mg | ORAL_CAPSULE | Freq: Three times a day (TID) | ORAL | Status: DC

## 2015-11-14 MED ORDER — TEMAZEPAM 15 MG PO CAPS
15.0000 mg | ORAL_CAPSULE | Freq: Every evening | ORAL | Status: DC | PRN
  Administered 2015-11-14 – 2015-11-25 (×11): 15 mg via ORAL
  Filled 2015-11-14 (×12): qty 1

## 2015-11-14 NOTE — Progress Notes (Signed)
CSW received a call from Terri of [employer] who states that she wanted to confirm that the patient's insurance is Medicaid. Also, she states that the patient is currently under review at facility.  Trish MageBrittney Norissa Bartee, LCSWA [phone] ED CSW 11/14/2015 5:19 PM

## 2015-11-14 NOTE — BH Assessment (Signed)
[facility] Assessment Progress Note Pt acc to 400-2 per Inetta Fermoina to come after 7pm. Voluntary paperwork to be signed and faxed.

## 2015-11-14 NOTE — BH Assessment (Addendum)
[facility] Assessment Progress Note Pt declined by [place] per Delorise ShinerGrace due to marijuana use.

## 2015-11-14 NOTE — ED Notes (Signed)
[facility]; Charge nurse on Adult unit states that they are unable to accept report at this time and requests that we call back after 7pm to report on this patient prior to transfer.

## 2015-11-14 NOTE — ED Notes (Signed)
Pt is awake and alert, requesting ibuprofen.  This RN explained that she cannot have ibuprofen until 4:30pm; the pt became angry and refused her keflex, neurontin, and lexapro stating "I have this terrible anxiety and no one is doing a thing about it, pushing all these weird fucking drugs and not giving me what I need for my nerve pain." RN explained that neurontin is prescribed for nerve pain and the keflex is intended to treat her UTI.  The patient states that she knows what neurontin is and that it does not help her pain and denies the presence of a UTI, stating, "I just want to be discharged so I can go home and shoot myself in the fucking head. This is bullshit." She then turned on her side and refused to speak further.
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
## 2015-11-14 NOTE — Progress Notes (Addendum)
CSW faxed referrals for possible inpatient[facility]  [facility]  Message left: [facility]  Confirmation pages received from: [facility]  [place] Nadia Viar, [place] [phone] ED CSW 11/14/2015 2:26 PM

## 2015-11-14 NOTE — Progress Notes (Signed)
Annette Montgomery was [facility] from [facility] with plan but no intent.  She wouldn't elaborate but states that she will contract for safety on the unit.  She was very agitated during the assessment.  She would only answer short questions.  She denies HI or A/V hallucinations.  Reports from the ED that she was refusing her PO medications because she is wanting ativan.  She reports that she has a history of Hep C and chronic neck pain.  Admission paperwork completed and signed.  Skin assessment completed and no skin issues noted.  Belonging searched and secured in locker # 41 Charger cord, coat, cell phone, pillow case, PJ pants, lighter and yellow pill.  Oriented her to the unit.  Q 15 minute checks initiated for safety.  We will monitor the progress towards her goals.

## 2015-11-14 NOTE — ED Notes (Signed)
MD at bedside. 

## 2015-11-14 NOTE — Progress Notes (Addendum)
Pt stated, "all my nerves hurt very bad and I feel very anxious. I take at home restoril 30mg  tablet at bedtime and a total of 2 mg of ativan a day. Gabapentin and visteral do not work for me. Advertising account executive" Spoke with NP concerning pts requests. Pt does appear anxious. She stated the last time she took ativan was about one week ago. Pt remains a 1;1. Infromed pt the NP will not order ativan and restoril . Pt stated, "well if she will not order that how about Doxepin." Pt refused taking tylenol stating,. I have hept C.Informed pt she is not due for motrin yet. Pt is annoyed stating ,'my anxiety is really bad . " pt was offered visteral and refused. Pt was offered a coke or coffee with caffeine and refused. 3:20pm Report to the new shift.

## 2015-11-14 NOTE — BH Assessment (Signed)
[facility] Assessment Progress Note  The following facilities have been contacted to seek placement for this pt, with results as noted:  Beds available, information sent, decision pending:  [facility] [place]   [facility]   Nissa Stannard, [place] Triage Specialist [phone]

## 2015-11-14 NOTE — ED Notes (Signed)
Attempted calling report again but the admission nurse is on break and will call back.

## 2015-11-14 NOTE — ED Notes (Addendum)
Pt is sleeping with tv on in the room; door is open and interior is clearly visible from nurse's station. Safety sitter is directly outside the room and is maintaining pt safety.

## 2015-11-14 NOTE — Tx Team (Addendum)
Initial Interdisciplinary Treatment Plan   PATIENT STRESSORS: Financial difficulties Medication change or noncompliance   PATIENT STRENGTHS: Wellsite geologistCommunication skills General fund of knowledge   PROBLEM LIST: Problem List/Patient Goals Date to be addressed Date deferred Reason deferred Estimated date of resolution  Depression 11/14/15     Anxiety 11/14/15     Suicidal ideation 11/14/15     "I don't know" 11/14/15     "I don't know because I don't have a sense of what you guys are going to help me with" 11/14/15                              DISCHARGE CRITERIA:  Need for constant or close observation no longer present Verbal commitment to aftercare and medication compliance  PRELIMINARY DISCHARGE PLAN: Outpatient therapy Medication management  PATIENT/FAMIILY INVOLVEMENT: This treatment plan has been presented to and reviewed with the patient, Annette Montgomery.  The patient and family have been given the opportunity to ask questions and make suggestions.  Norm ParcelHeather V Reddick 11/14/2015, 11:22 PM

## 2015-11-14 NOTE — ED Notes (Signed)
Pt is apologetic for previous behavior and agreed to take her antibiotic after additional coaching from this RN. She refuses lexapro and neurontin and states that she does not take these medications at home and therefore will not start taking them here.  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 She is currently calm and cooperative and resting in bed watching tv.

## 2015-11-15 DIAGNOSIS — F332 Major depressive disorder, recurrent severe without psychotic features: Principal | ICD-10-CM

## 2015-11-15 DIAGNOSIS — F4310 Post-traumatic stress disorder, unspecified: Secondary | ICD-10-CM

## 2015-11-15 MED ORDER — IBUPROFEN 600 MG PO TABS: 600.0000 mg | ORAL_TABLET | Freq: Four times a day (QID) | ORAL | Status: DC | PRN

## 2015-11-15 MED ORDER — ESCITALOPRAM OXALATE 10 MG PO TABS
10.0000 mg | ORAL_TABLET | Freq: Every day | ORAL | Status: DC
  Administered 2015-11-16 – 2015-11-18 (×3): 10 mg via ORAL
  Filled 2015-11-15 (×5): qty 1

## 2015-11-15 MED ORDER — IBUPROFEN 600 MG PO TABS
600.0000 mg | ORAL_TABLET | Freq: Four times a day (QID) | ORAL | Status: DC | PRN
  Administered 2015-11-15 – 2015-11-26 (×16): 600 mg via ORAL
  Filled 2015-11-15 (×18): qty 1

## 2015-11-15 NOTE — Plan of Care (Signed)
Problem: Consults Goal: Depression Patient Education See Patient Education Module for education specifics.  Outcome: Progressing Nurse discussed depression/coping skills with patient.        

## 2015-11-15 NOTE — Progress Notes (Signed)
D:  Patient's self inventory sheet, patient has poor sleep, sleep medication was helpful, but it was too low of a dose.  Fair appetite, low energy level, poor concentration.  Denied withdrawals.  SI, contracts for safety.  Physical problems, pain, headaches, worst pain in past 24 hours is #8.  Goal is getting meds in order.  Plans to talk to MD and meditate.  No discharge plans. A:  Emotional support and encouragement given patient. R:  Denied SI and HI while talking to nurse, contracts for safety.  Denied A/V hallucinations.  Safety maintained with 15 minute checks.

## 2015-11-15 NOTE — [facility] Group Notes (Signed)
[facility] LCSW Group Therapy 11/15/2015  1:15 PM   Type of Therapy: Group Therapy  Participation Level: Did Not Attend. Patient invited to participate but declined.   Arnett Duddy, MSW, LCSW Clinical Social Worker [facility] [phone]   
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
## 2015-11-15 NOTE — Progress Notes (Signed)
Recreation Therapy Notes  Animal-Assisted Activity (AAA) Program Checklist/Progress Notes Patient Eligibility Criteria Checklist & Daily Group note for Rec Tx Intervention  Date: 03.21.2017 Time: 2:45pm Location: 400 Hall Dayroom    AAA/T Program Assumption of Risk Form signed by Patient/ or Parent Legal Guardian yes  Patient is free of allergies or sever asthma yes  Patient reports no fear of animals yes  Patient reports no history of cruelty to animals yes  Patient understands his/her participation is voluntary yes  Patient washes hands before animal contact yes  Patient washes hands after animal contact yes  Behavioral Response: Appropriate   Education: Hand Washing, Appropriate Animal Interaction   Education Outcome: Acknowledges education.   Clinical Observations/Feedback: Patient engaged appropriately with therapy dog and peers in session. Patient asked appropriate questions about therapy dog and his training.   Annette Montgomery L Hyun Reali, LRT/CTRS         Annette Montgomery L 11/15/2015 3:58 PM 

## 2015-11-15 NOTE — BHH Group Notes (Signed)
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
## 2015-11-16 DIAGNOSIS — F332 Major depressive disorder, recurrent severe without psychotic features: Secondary | ICD-10-CM

## 2015-11-16 MED ORDER — GABAPENTIN 300 MG PO CAPS
300.0000 mg | ORAL_CAPSULE | Freq: Every morning | ORAL | Status: DC
  Administered 2015-11-17 – 2015-11-21 (×4): 300 mg via ORAL
  Filled 2015-11-16 (×10): qty 1

## 2015-11-16 MED ORDER — GABAPENTIN 300 MG PO CAPS
600.0000 mg | ORAL_CAPSULE | Freq: Every day | ORAL | Status: DC
  Administered 2015-11-16 – 2015-11-20 (×5): 600 mg via ORAL
  Filled 2015-11-16 (×8): qty 2

## 2015-11-16 NOTE — Progress Notes (Signed)
D    Pt observed on milieu interacting appropriately with staff and peers    She complained of not getting her full dose of 30 mg of restoril and said the doctor said he planned to increase her dosage to 30 mg   A    Encouraged pt to discuss medications with her doctor    Verbal support given    Medications administered and effectiveness monitored    Q 15 min checks R    Pt safe at present

## 2015-11-16 NOTE — [facility] Group Notes (Signed)
[facility] LCSW Group Therapy 11/16/2015  1:15 PM   Type of Therapy: Group Therapy  Participation Level: Did Not Attend. Patient invited to participate but declined.   Blandina Renaldo, MSW, LCSW Clinical Social Worker [facility] [phone]   

## 2015-11-16 NOTE — Progress Notes (Signed)
Adult Psychoeducational Group Note  Date:  11/16/2015 Time:  9:25 PM  Group Topic/Focus:  Wrap-Up Group:   The focus of this group is to help patients review their daily goal of treatment and discuss progress on daily workbooks.  Participation Level:  Active  Participation Quality:  Appropriate  Affect:  Appropriate  Cognitive:  Alert  Insight: Appropriate  Engagement in Group:  Engaged  Modes of Intervention:  Discussion  Additional Comments:  Patient goal for today was to get meds straight. On a scale between 1-10, (1=worst, 10=best) patient rated her day a 1 because "my medication did not get changed".   Tsering Leaman L Lindell Renfrew 11/16/2015, 9:25 PM
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
## 2015-11-16 NOTE — Plan of Care (Signed)
Problem: Ineffective individual coping Goal: STG: Patient will remain free from self harm Outcome: Progressing Pt remains on Q 15 minutes checks as ordered without self harm gestures. However, pt continued to endorse depression. Emotional support offered.   Problem: Alteration in mood Goal: STG-Patient is able to discuss feelings and issues (Patient is able to discuss feelings and issues leading to depression)  Outcome: Progressing Pt did verbalized her concerns related to her depressed mood and insomnia. Concerns discussed with assigned physician.

## 2015-11-16 NOTE — Progress Notes (Signed)
D: Pt A & O X 4. Presents with labile mood (irritable & depressed). Pt denies SI, HI and AVH when assessed. Voiced concerns about insomnia related to medication dose (Restoril 15 mg PO instead of 30 mg). Pt refused scheduled dose of Neurontin this AM and at noon. Reluctantly took her Lexapro this AM with some encouragement. Pt is guarded, minimal interactions noted with others. Pt did not attend scheduled groups on unit.  A: Verbal encouragement offered to pt related to treatment compliance. Support and availability provided to pt. [facility] including PRN Motrin for c/o neck pain. Q 15 minutes checks maintained for safety on and off unit. R: Pt remains safe on unit. POC maintained.

## 2015-11-17 NOTE — [facility] Suicide Risk Assessment (Signed)
[facility] INPATIENT:  Family/Significant Other Suicide Prevention Education  Suicide Prevention Education:  Patient Refusal for Family/Significant Other Suicide Prevention Education: The patient Annette SamStacey Ann Montgomery has refused to provide written consent for family/significant other to be provided Family/Significant Other Suicide Prevention Education during admission and/or prior to discharge.  Physician notified. SPE reviewed with patient and brochure provided. Patient encouraged to return to hospital if having suicidal thoughts, patient verbalized his/her understanding and has no further questions at this time.   Grier Vu, [place] CarboKristin L 11/17/2015, 10:27 AM

## 2015-11-17 NOTE — Tx Team (Signed)
Interdisciplinary Treatment Plan Update (Adult) Date: 11/17/2015    Time Reviewed: 9:30 AM  Progress in Treatment: Attending groups: Continuing to assess, patient new to milieu Participating in groups: Continuing to assess, patient new to milieu Taking medication as prescribed: Yes Tolerating medication: Yes Family/Significant other contact made: No, patient has declined for family contact Patient understands diagnosis: Yes Discussing patient identified problems/goals with staff: Yes Medical problems stabilized or resolved: Yes Denies suicidal/homicidal ideation: Yes Issues/concerns per patient self-inventory: Yes Other:  New problem(s) identified: N/A  Discharge Plan or Barriers: CSW continuing to assess, patient new to milieu. Patient is homeless with no social supports. She declines to stay in a homeless shelter at discharge due to feeling unsafe there. She is interested in [facility] at discharge.  Reason for Continuation of Hospitalization:  Depression Anxiety Medication Stabilization   Comments: N/A  Estimated length of stay: 2-3 days    Patient is a 59 year old female with a diagnosis of Major Depressive Disorder and PTSD. Pt presented to the hospital with depression and suicidal ideations. Pt reports primary trigger(s) for admission was homelessness, financial stressors, lack of social supports. Patient will benefit from crisis stabilization, medication evaluation, group therapy and psycho education in addition to case management for discharge planning. At discharge, it is recommended that Pt remain compliant with established discharge plan and continued treatment   Review of initial/current patient goals per problem list:  1. Goal(s): Patient will participate in aftercare plan   Met: No   Target date: 3-5 days post admission date   As evidenced by: Patient will participate within aftercare plan AEB aftercare provider and housing plan at discharge being  identified.   3/23: Goal not met: CSW assessing for appropriate referrals for pt and will have follow up secured prior to d/c.   2. Goal (s): Patient will exhibit decreased depressive symptoms and suicidal ideations.   Met: No   Target date: 3-5 days post admission date   As evidenced by: Patient will utilize self rating of depression at 3 or below and demonstrate decreased signs of depression or be deemed stable for discharge by MD.   3/22: Patient rates depression at 8.  3. Goal(s): Patient will demonstrate decreased signs and symptoms of anxiety.   Met: No   Target date: 3-5 days post admission date   As evidenced by: Patient will utilize self rating of anxiety at 3 or below and demonstrated decreased signs of anxiety, or be deemed stable for discharge by MD  3/23: Patient rates anxiety at 9.      Attendees: Patient:    Family:    Physician: Dr. Cobos; Dr. Lugo 11/17/2015 9:30 AM  Nursing: Beverly Knight, Roni Waller, Sara Twyman, Caroline Beaudry, Patrice White, RN 11/17/2015 9:30 AM  Clinical Social Worker:  , LCSW 11/17/2015 9:30 AM  Other: Lauren Carter, LCSWA; Heather Smart, LCSW  11/17/2015 9:30 AM  Other:  11/17/2015 9:30 AM  Other:  11/17/2015 9:30 AM  Other: May Augustin, NP 11/17/2015 9:30 AM  Other:              Scribe for Treatment Team:   , LCSW [phone]     

## 2015-11-17 NOTE — Progress Notes (Signed)
D   Pt is somewhat upset about her sleep medication not being increased but she did agree to take the neurontin ordered for her    She said the neurontin just really did not work for her like the restoril does    She is pleasant and appropriate in her interactions with others  A    Verbal support given   Medications administered and effectiveness monitored   Q 15 min checks R   Pt safe at present

## 2015-11-17 NOTE — Progress Notes (Signed)
Adult Psychoeducational Group Note  Date:  11/17/2015 Time:  8:43 PM  Group Topic/Focus:  Wrap-Up Group:   The focus of this group is to help patients review their daily goal of treatment and discuss progress on daily workbooks.  Participation Level:  Did Not Attend  Participation Quality:  Did not attend  Affect:  Did not attend  Cognitive:  Did not attend  Insight: None  Engagement in Group:  Did not attend  Modes of Intervention:  Did not attend  Additional Comments:  Patient did not attend wrap up group tonight.   Natha Guin L Corean Yoshimura 11/17/2015, 8:43 PM

## 2015-11-17 NOTE — [facility] Group Notes (Signed)
LATE ENTRY FROM 11/16/15:     [facility]  11/16/2015  8:45 AM   Participation Quality: Alert, Appropriate and Oriented  Mood/Affect: Depressed and Flat  Depression Rating: 8  Anxiety Rating: 9  Thoughts of Suicide: Pt denies SI/HI  Will you contract for safety? Yes  Current AVH: Pt denies  Plan for Discharge/Comments: Pt attended discharge planning group and actively participated in group. CSW provided pt with today's workbook. Patient reports that she is homeless and has no supports. She is not interested in staying in a homeless shelter. [facility] [address] [facility] [place]  Transportation Means: Pt denies access to transportation  Supports: No supports mentioned at this time  Samuella BruinKristin Desirey Keahey, MSW, Johnson [employer] JohnsonLCSW Clinical Social Worker [facility] [phone]

## 2015-11-17 NOTE — [facility] Group Notes (Signed)
The focus of this group is to educate the patient on the purpose and policies of crisis stabilization and provide a format to answer questions about their admission.  The group details unit policies and expectations of patients while admitted.  Patient attended 0900 nurse education orientation group this morning.  Patient actively participated and had appropriate affect.  Patient is alert.  Patient had appropriate insight and appropriate engagement.  Today patient will work on 3 goals for discharge.  
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
## 2015-11-17 NOTE — [facility] Group Notes (Signed)
[facility] LCSW Group Therapy 11/17/2015 1:15 PM Type of Therapy: Group Therapy Participation Level: Active  Participation Quality: Attentive, Sharing and Supportive  Affect: Depressed and Flat  Cognitive: Alert and Oriented  Insight: Developing/Improving and Engaged  Engagement in Therapy: Developing/Improving and Engaged  Modes of Intervention: Activity, Clarification, Confrontation, Discussion, Education, Exploration, Limit-setting, Orientation, Problem-solving, Rapport Building, Reality Testing, Socialization and Support  Summary of Progress/Problems: Patient was attentive and engaged with speaker from [employer]. Patient was attentive to speaker while they shared their story of dealing with mental health and overcoming it. Patient expressed interest in their programs and services and received information on their agency. Patient processed ways they can relate to the speaker.   Leocadia Idleman, LCSW Clinical Social Worker [facility] [phone]   
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
## 2015-11-18 LAB — HIV ANTIBODY (ROUTINE TESTING W REFLEX): HIV Screen 4th Generation wRfx: NONREACTIVE

## 2015-11-18 MED ORDER — ESCITALOPRAM OXALATE 20 MG PO TABS
20.0000 mg | ORAL_TABLET | Freq: Every day | ORAL | Status: DC
  Filled 2015-11-18 (×5): qty 1

## 2015-11-18 MED ORDER — ENSURE ENLIVE PO LIQD
237.0000 mL | Freq: Two times a day (BID) | ORAL | Status: DC
  Administered 2015-11-19 – 2015-11-26 (×8): 237 mL via ORAL
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
## 2015-11-18 NOTE — Progress Notes (Signed)
Adult Psychoeducational Group Note  Date:  11/18/2015 Time:  8:20 PM  Group Topic/Focus:  Wrap-Up Group:   The focus of this group is to help patients review their daily goal of treatment and discuss progress on daily workbooks.  Participation Level:  Active  Participation Quality:  Appropriate  Affect:  Anxious and Depressed  Cognitive:  Appropriate  Insight: Appropriate  Engagement in Group:  Engaged  Modes of Intervention:  Discussion  Additional Comments:  Pt was pleasant, but seemed anxious and slightly depressed during wrap-up group. Pt rated her overall day a 5 out of 10 because she says that her depression is getting better, however, she had two panic attacks today. Pt reported that she did not achieve her goal for the day, which was to feel better.   Annette NeerJasmine S Tiaria Montgomery 11/18/2015, 8:47 PM

## 2015-11-18 NOTE — Progress Notes (Signed)
D: Patient denies SI/HI or AVH. Patient has a flat affect and depressed mood.  Pt. Slept the majority of the evening however awoke to take her evening medication  And returned to bed.  Pt. Did not attend her evening group but is going to the cafeteria for meals.   A: Patient given emotional support from RN. Patient encouraged to come to staff with concerns and/or questions. Patient's medication routine continued. Patient's orders and plan of care reviewed.   R: Patient remains appropriate and cooperative. Will continue to monitor patient q15 minutes for safety.

## 2015-11-18 NOTE — Progress Notes (Signed)
Patient has been isolative to room/bed much of the day. Flat in affect, mood depressed and anxious. Rates depression at a 9/10, hopelessness and anxiety both at an 8/10. Reports her goal is to attend groups and to work through her depression and anxiety. 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 States she treated many patients who were suicidal. Complaining of neck pain of an 8/10 as well as constipation. Medicated per orders, prn MOM and advil given. Pain decreased however patient reports no results from MOM. Encouraged to speak with MD regarding discomfort. Emotional support and reassurance provided. Self inventory reviewed. Patient endorsing passive SI however verbally contracts for safety. No HI. Patient remains safe on level III obs. Lawrence MarseillesFriedman, Annette Montgomery

## 2015-11-18 NOTE — [facility] Group Notes (Signed)
[facility] LCSW Group Therapy 11/18/2015 1:15 PM Type of Therapy: Group Therapy Participation Level: Active  Participation Quality: Attentive, Sharing and Supportive  Affect: Blunted  Cognitive: Alert and Oriented  Insight: Developing/Improving and Engaged  Engagement in Therapy: Developing/Improving and Engaged  Modes of Intervention: Clarification, Confrontation, Discussion, Education, Exploration, Limit-setting, Orientation, Problem-solving, Rapport Building, Dance movement psychotherapisteality Testing, Socialization and Support  Summary of Progress/Problems: The topic for today was feelings about relapse. Pt discussed what relapse prevention is to them and identified triggers that they are on the path to relapse. Pt processed their feeling towards relapse and was able to relate to peers. Pt discussed coping skills that can be used for relapse prevention. Patient participated minimally in discussion but did share that she finds her spirituality helpful in her well-being.   Samuella BruinKristin Jermale Crass, MSW, LCSW Clinical Social Worker [facility] [phone]

## 2015-11-18 NOTE — [facility] Group Notes (Signed)
   [facility]  11/18/2015  8:45 AM   Participation Quality: Alert, Appropriate and Oriented  Mood/Affect: Depressed and Flat  Depression Rating: 8  Anxiety Rating: 8  Thoughts of Suicide: Pt denies SI/HI  Will you contract for safety? Yes  Current AVH: Pt denies  Plan for Discharge/Comments: Pt attended discharge planning group and actively participated in group. CSW provided pt with today's workbook. Patient reports not feeling well today- experiencing high levels of depression and anxiety today. [facility] similar type [facility] at discharge.  Transportation Means: Pt denies access to transportation  Supports: No supports mentioned at this time  Samuella BruinKristin Unity Luepke, MSW, Johnson [employer] JohnsonLCSW Clinical Social Worker [facility] [phone]

## 2015-11-18 NOTE — Plan of Care (Signed)
Problem: Alteration in mood; excessive anxiety as evidenced by: Goal: STG-Pt will report an absence of self-harm thoughts/actions (Patient will report an absence of self-harm thoughts or actions)  Outcome: Not Progressing Patient still endorsing passive SI. Verbally contracts for safety.  Problem: Diagnosis: Increased Risk For Suicide Attempt Goal: STG-Patient Will Comply With Medication Regime Outcome: Progressing Patient has been med compliant.
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
## 2015-11-19 DIAGNOSIS — F329 Major depressive disorder, single episode, unspecified: Secondary | ICD-10-CM

## 2015-11-19 MED ORDER — HYDRALAZINE HCL 10 MG PO TABS
10.0000 mg | ORAL_TABLET | Freq: Every day | ORAL | Status: DC
  Administered 2015-11-24 – 2015-11-25 (×2): 10 mg via ORAL
  Filled 2015-11-19 (×9): qty 1

## 2015-11-19 MED ORDER — LORAZEPAM 1 MG PO TABS
1.0000 mg | ORAL_TABLET | Freq: Once | ORAL | Status: AC
  Administered 2015-11-19: 1 mg via ORAL
  Filled 2015-11-19: qty 1

## 2015-11-19 NOTE — Progress Notes (Signed)
[facility] MD Progress Note  11/19/2015 10:38 AM Annette Montgomery  MRN:  [id number]   Subjective: Reports she continues to endorse depression and sadness. Patient reports " I need ativan"   Objective :Annette Montgomery is awake, alert and oriented X4 , found resting in bedroom. Patient appears flat,depressed and irritable. Reports suicidal ideation because she can't have ativan for her anxiety. Denies homicidal ideation at this time. Denies auditory or visual hallucination and does not appear to be responding to internal stimuli. Patient reports she is not going to taking any of her medication. Reports that ativan helps with her anxiety and blood pressure. States that the reason that her blood pressure is elevated". States " I cant put a number to my depression, because its related to by anxiety.  Reports poor appetite and states she is not resting well. Support, encouragement and reassurance was provided.   Principal Problem:  Depression  Diagnosis:   Patient Active Problem List   Diagnosis Date Noted  . Severe episode of recurrent major depressive disorder, without psychotic features (HCC) [F33.2]   . Major depressive disorder, recurrent episode, severe (HCC) [F33.2] 11/14/2015  . Major depressive disorder, recurrent episode, moderate (HCC) [F33.1] 11/13/2015  . Anxiety disorder [F41.9] 11/13/2015  . Encounter for preadmission testing [Z01.818]   . Insomnia [G47.00]   . Drug-seeking behavior [F19.10] 08/16/2015  . HTN (hypertension) [I10] 07/19/2015  . Substance induced mood disorder (HCC) [F19.94] 06/20/2015  . COPD (chronic obstructive pulmonary disease) (HCC) [J44.9] 01/19/2015  . Tobacco abuse [Z72.0] 01/12/2015  . COPD exacerbation (HCC) [J44.1] 01/05/2015  . Anxiety [F41.9] 01/05/2015  . 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 Polysubstance abuse [F19.10] 12/30/2014  . Benzodiazepine abuse [F13.10] 12/30/2014  . Mood disorder (HCC) [F39] 11/29/2014   Total Time spent with  patient: 25 minutes     Past Medical History:  Past Medical History  Diagnosis Date  . Anxiety     was managed with BZDs until begining of May  . Insomnia   . Neck injury   . Back pain   . Pulmonary embolism (HCC) 2015    treated with warfarin for 6 months, reports it was found by accident  . PTSD (post-traumatic stress disorder)     lost brother to ARDS, father to Alzheimers  . Panic attacks   . Polysubstance abuse     reports narcotics 20 years ago, has been on methadone for 20 years, also admits marijuana   . Homelessness   . Pneumonia 2014    Past Surgical History  Procedure Laterality Date  . Tonsillectomy     Family History:  Family History  Problem Relation Age of Onset  . Cancer Mother   . Dementia Father     Social History:  History  Alcohol Use No     History  Drug Use No    Social History   Social History  . Marital Status: Single    Spouse Name: N/A  . Number of Children: N/A  . Years of Education: N/A   Social History Main Topics  . Smoking status: Former Smoker -- 0.15 packs/day for 10 years    Types: Cigarettes  . Smokeless tobacco: Never Used  . Alcohol Use: No  . Drug Use: No  . Sexual Activity: Not Asked     Comment: patient smokes 1 cigarette a day   Other Topics Concern  . None   Social History Narrative   From [place], came to [place] to help fellow homeless person  get home, was then kicked out by his mother and lost money and ID on walk from [place] to [place].  Plans to go back to [place] June 1st, 2016   Additional Social History:   Sleep: improving   Appetite:  Gradually improving   Current Medications: Current Facility-Administered Medications  Medication Dose Route Frequency Provider Last Rate Last Dose  . alum & mag hydroxide-simeth (MAALOX/MYLANTA) 200-200-20 MG/5ML suspension 30 mL  30 mL Oral PRN Charm Rings, NP      . cephALEXin (KEFLEX) capsule 500 mg  500 mg Oral 3 times per day Charm Rings, NP   500 mg  at 11/19/15 0609  . escitalopram (LEXAPRO) tablet 20 mg  20 mg Oral Daily Rockey Situ Cobos, MD   20 mg at 11/19/15 0800  . feeding supplement (ENSURE ENLIVE) (ENSURE ENLIVE) liquid 237 mL  237 mL Oral BID BM Fernando A Cobos, MD      . gabapentin (NEURONTIN) capsule 300 mg  300 mg Oral q morning - 10a Craige Cotta, MD   300 mg at 11/18/15 0904  . gabapentin (NEURONTIN) capsule 600 mg  600 mg Oral QHS Rockey Situ Cobos, MD   600 mg at 11/18/15 2107  . hydrOXYzine (ATARAX/VISTARIL) tablet 25 mg  25 mg Oral TID PRN Charm Rings, NP      . ibuprofen (ADVIL,MOTRIN) tablet 600 mg  600 mg Oral Q6H PRN Craige Cotta, MD   600 mg at 11/19/15 0609  . magnesium hydroxide (MILK OF MAGNESIA) suspension 30 mL  30 mL Oral Daily PRN Charm Rings, NP   30 mL at 11/18/15 0951  . nicotine (NICODERM CQ - dosed in mg/24 hours) patch 21 mg  21 mg Transdermal Daily Charm Rings, NP   21 mg at 11/18/15 0903  . ondansetron (ZOFRAN) tablet 4 mg  4 mg Oral Q8H PRN Charm Rings, NP      . temazepam (RESTORIL) capsule 15 mg  15 mg Oral QHS PRN Kerry Hough, PA-C   15 mg at 11/18/15 2105    Lab Results:  Results for orders placed or performed during the hospital encounter of 11/14/15 (from the past 48 hour(s))  HIV antibody     Status: None   Collection Time: 11/17/15  7:02 PM  Result Value Ref Range   HIV Screen 4th Generation wRfx Non Reactive Non Reactive    Comment: (NOTE) Performed At: [facility] [address] [place], [place] [id number] Mila Homer MD [id number] Performed at [facility]     Blood Alcohol level:  Lab Results  Component Value Date   [facility] <5 11/12/2015   ETH <5 08/02/2015    Physical Findings: AIMS: Facial and Oral Movements Muscles of Facial Expression: None, normal Lips and Perioral Area: None, normal Jaw: None, normal Tongue: None, normal,Extremity Movements Upper (arms, wrists, hands, fingers): None, normal Lower (legs, knees,  ankles, toes): None, normal, Trunk Movements Neck, shoulders, hips: None, normal, Overall Severity Severity of abnormal movements (highest score from questions above): None, normal Incapacitation due to abnormal movements: None, normal Patient's awareness of abnormal movements (rate only patient's report): No Awareness, Dental Status Current problems with teeth and/or dentures?: No Does patient usually wear dentures?: No  CIWA:  CIWA-Ar Total: 1 COWS:  COWS Total Score: 2  Musculoskeletal: Strength & Muscle Tone: within normal limits Gait & Station: normal Patient leans: N/A  Psychiatric Specialty Exam: Review of Systems  Psychiatric/Behavioral: Positive for  depression and suicidal ideas. Negative for hallucinations. The patient is nervous/anxious and has insomnia.   All other systems reviewed and are negative.  denies headache, denies chest pain, denies SOB  Blood pressure 151/94, pulse 81, temperature 98.1 F (36.7 C), temperature source Oral, resp. rate 16, height 5\' 7"  (1.702 m), weight 55.792 kg (123 lb), SpO2 98 %.Body mass index is 19.26 kg/(m^2).  General Appearance:  Improved grooming   Eye Contact::  Good  Speech:  Normal Rate  Volume:  Normal  Mood:  Remains depressed   Affect: still constricted, but more reactive than on admission   Thought Process:  Linear  Orientation:  Full (Time, Place, and Person)  Thought Content:  Rumination and denies hallucinations, no delusions   Suicidal Thoughts:  No at this time denies any plan or intention of SI or of hurting self on unit, contracts for safety on unit.   Homicidal Thoughts:  No  Memory:  recent and remote grossly intact   Judgement:  improved  Insight:  Improved   Psychomotor Activity:   More visible on unit   Concentration:  Good  Recall:  Good  Fund of Knowledge:Good  Language: Good  Akathisia:  Negative  Handed:  Right  AIMS (if indicated):     Assets:  Desire for Improvement Resilience  ADL's:   Fair    Cognition: WNL  Sleep:  Number of Hours: 6.5     I agree with current treatment plan on 11/19/2015, Patient seen face-to-face for psychiatric evaluation follow-up, chart reviewed. Reviewed the information documented and agree with the treatment plan.  Treatment Plan Summary: Daily contact with patient to assess and evaluate symptoms and progress in treatment, Medication management, Plan inpatient admission and medications as below  Continue to encourage group, milieu participation to work on coping skills and symptom reduction Start Apresoline 10 mg PO QD for HTN Continue  Lexapro to 20  mgrs QDAY for ongoing  depression and anxiety Continue Neurontin at 300 mgrs QAM and 600 mgrs QHS - for anxiety, pain, and insomnia  Continue Temazepam 15 mgrs QHS PRN for insomnia as needed  Continue Vistaril 25 mgrs Q 8 hours PRN for anxiety as needed  Start Ensure supplementation to help regain weight - patient states she  had lost significant weight over recent months   Oneta Rackanika N Lewis, NP 11/19/2015, 10:38 AM I agreed with findings and treatment plan of this patient

## 2015-11-19 NOTE — [facility] Group Notes (Addendum)
[facility] LCSW Group Therapy  11/19/2015   10:00 AM   Type of Therapy:  Group Therapy  Participation Level:  Active  Participation Quality:  Appropriate and Attentive  Affect:  Flat and depressed  Cognitive:  Alert and Appropriate  Insight:  Developing/Improving and Engaged  Engagement in Therapy:  Developing/Improving and Engaged  Modes of Intervention:  Clarification, Confrontation, Discussion, Education, Exploration, Limit-setting, Orientation, Problem-solving, Rapport Building, Dance movement psychotherapisteality Testing, Socialization and Support  Summary of Progress/Problems: Today's group topic was healthy coping skills. Group members discussed positive, healthy coping skills that have worked for them in the past and were able to relate to one another on what has worked/hasn't worked. Group members were asked to process how these positive coping skills could be utilized in the future, to prevent relapse.  Pt was quiet for most of group and appeared to actively listen to group discussion.  Pt did share that bottling everything and not sharing how one feels is not helpful, but denied this related to her.    Jeanelle MallingChelsea Jakub Debold, [place]CS[place] 11/19/2015 1:37 PM

## 2015-11-19 NOTE — Progress Notes (Signed)
Patient has been very irritable, labile today. Upset with meds currently prescribed. Feels she is not being heard by staff. "I have told him lexapro does not work for me. I've tried it before and it doesn't help. The neurontin isn't helping either and I don't want to be on a lot of meds. I feel liked meds are just being thrown my way." BP elevated and patient reports this is due to uncontrolled anxiety, not HTN. Refusing apresoline. States vistaril does not help her anxiety and makes her feel "funny.""I'm just frustrated and so anxious." Patient spoke with NP and conveyed this information in front of this Clinical research associatewriter.  Patient argumentative at times however was later remorseful. Patient was ordered one time dose of ativan which was given prior to lunch. On reassess of BP after lunch, it remained elevated. Patient offered emotional support, self inventory reviewed. Rates her depression at an 8/10, hopelessness at a 9/10 and anxiety at a 10/10. Neck pain remains at a 7/10 and patient does not wish to take anything for it again stating "it's the result of my anxiety." She denies SI/HI and remains safe on level III obs.

## 2015-11-20 MED ORDER — BACLOFEN 10 MG PO TABS
10.0000 mg | ORAL_TABLET | Freq: Once | ORAL | Status: DC
  Filled 2015-11-20: qty 1

## 2015-11-20 MED ORDER — BUTALBITAL-APAP-CAFFEINE 50-325-40 MG PO TABS
1.0000 | ORAL_TABLET | Freq: Once | ORAL | Status: AC
  Administered 2015-11-20: 1 via ORAL
  Filled 2015-11-20: qty 1

## 2015-11-20 MED ORDER — BUTALBITAL-APAP-CAFFEINE 50-325-40 MG PO TABS
2.0000 | ORAL_TABLET | Freq: Once | ORAL | Status: AC
  Administered 2015-11-20: 2 via ORAL
  Filled 2015-11-20: qty 2

## 2015-11-20 NOTE — Progress Notes (Signed)
Patient ID: Annette Montgomery, female   DOB: 03/13/1956, 60 y.o.   MRN: [id number]  DAR: Pt. Denies HI and A/V Hallucinations. She dose report passive SI and is able to contract for safety. Patient does continue to report pain in neck and head area. She reports, "this has been going on since four a.m. and night shift didn't do anything." It is of note that patient had an order for Baclofen and she refused it. She also has allergies to most pain medication that are non-narcotic and reports the one time dose of Fiorecet she vomited. Prior to administration patient reported that she does not have an allergy to Acetaminophen but rather she does not take it due to having Hepatitis C. She was also encouraged to take Zofran prior to its administration but refused until after reporting she vomited her Fiorecet. Writer looked where patient stated she vomited but only noted clear liquid. No pill noted in vomitus. NP Tanika notified. Support and encouragement provided to the patient. Heat packs provided to patient as well. Patient asked for pain medication by name such as Vicodin and stated, "If I was at home I'd go to the E.R. And get a shot of morphine and be at home feeling fine."  Patient refused her medications at this time other than one time dose of Fiorecet and Zofran. Patient staying in her bed with her arms up and hands holding the sides of her head. She reports sensitivity to smell, sound, and light. Q15 minute checks are maintained for safety.

## 2015-11-20 NOTE — Progress Notes (Signed)
D.  Pt in hall on approach, pleasant but anxious.  Pt states that she has been suffering from a neck pain induced migraine for days now with little relief.  Pt did receive Fioricet earlier today.  Pt states she gets these headaches infrequently and outside of this facility it would have been "no big deal".  Pt states she normally goes to the ED and receives medication that "knocks it out".  Pt hopeful for discharge tomorrow.  Pt denies SI/HI/hallucinations at this time.  Pt did not feel well enough to attend evening wrap up group, she remained in bed.  Pt states she slept very little last night.  Pt observed interacting appropriately in dayroom with peers after receiving her bedtime medication.  A.  Support and encouragement offered, medications given as ordered  R.  Pt remains safe on unit, will continue to monitor.

## 2015-11-20 NOTE — [facility] Group Notes (Signed)
[facility] Group Notes:  (Nursing/MHT/Case Management/Adjunct)  Date:  11/20/2015  Time:  2:01 PM  Type of Therapy:  Psychoeducational Skills  Participation Level:  Did Not Attend  Participation Quality:  N/A  Affect:  N/A  Cognitive:  N/A  Insight:  None  Engagement in Group:  None  Modes of Intervention:  Discussion and Education  Summary of Progress/Problems: Patient was invited but did not attend group.   Ned ClinesDopson, Marquell Saenz E 11/20/2015, 2:01 PM

## 2015-11-20 NOTE — Progress Notes (Signed)
Patient has been up in the dayroom watching tv with little to no interaction with peers. She reports to Clinical research associatewriter that her day has not been good d/t her anxiety but when writer asked if she was aware of what triggered them she became defensive and replied, "if I knew that I would not be here." She was complient with medications, support givne and safety maintained on unit with 15 min checks.

## 2015-11-20 NOTE — Progress Notes (Signed)
Adult Psychoeducational Group Note  Date:  11/20/2015 Time:  9:12 PM  Group Topic/Focus:  Wrap-Up Group:   The focus of this group is to help patients review their daily goal of treatment and discuss progress on daily workbooks.  Participation Level:  Did Not Attend  Additional Comments:  Pt was in her room resting.  Caswell CorwinOwen, [name] C 11/20/2015, 9:12 PM

## 2015-11-20 NOTE — [facility] Group Notes (Signed)
11/20/2015  10:00 AM   Type of Therapy and Topic: Group Therapy: Developing Self Esteem and Improving Support System   Participation Level: Patient did not come to group after prompting by MHT   Beverly Sessionsywan J Gertrue Willette MSW, LCSW

## 2015-11-20 NOTE — Progress Notes (Signed)
[facility] MD Progress Note  11/20/2015 11:01 AM Annette Montgomery  MRN:  [id number]   Subjective: Reports she continues to endorse depression and sadness. Patient reports " I need ativan." Patient has complaints of "bad Nero headache."   Objective :Annette Montgomery is awake, alert and oriented X4 , found resting in bedroom. Patient appears flat,depressed and irritable. Patient reports past accident which causes "nero headache" that is only relived the strong pain medications and rest. Reports suicidal ideation because she can't have ativan for her anxiety report a "triad effect" . Denies homicidal ideation at this time. Denies auditory or visual hallucination and does not appear to be responding to internal stimuli. Patient reports she a past history of HEP C which is why she is not able to take tylenol and ibuprofen  doesn't work for me" . Patient reports taken florcet in the past and she has immediate relief.  States " I cant put a number to my depression due to my headache pain.   Reports poor appetite and states she is not resting well. Support, encouragement and reassurance was provided.   Principal Problem:  Depression  Diagnosis:   Patient Active Problem List   Diagnosis Date Noted  . Severe episode of recurrent major depressive disorder, without psychotic features (HCC) [F33.2]   . Major depressive disorder, recurrent episode, severe (HCC) [F33.2] 11/14/2015  . Major depressive disorder, recurrent episode, moderate (HCC) [F33.1] 11/13/2015  . Anxiety disorder [F41.9] 11/13/2015  . Encounter for preadmission testing [Z01.818]   . Insomnia [G47.00]   . Drug-seeking behavior [F19.10] 08/16/2015  . HTN (hypertension) [I10] 07/19/2015  . Substance induced mood disorder (HCC) [F19.94] 06/20/2015  . COPD (chronic obstructive pulmonary disease) (HCC) [J44.9] 01/19/2015  . Tobacco abuse [Z72.0] 01/12/2015  . COPD exacerbation (HCC) [J44.1] 01/05/2015  . Anxiety [F41.9] 01/05/2015  . 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 Polysubstance abuse [F19.10] 12/30/2014  . Benzodiazepine abuse [F13.10] 12/30/2014  . Mood disorder (HCC) [F39] 11/29/2014   Total Time spent with patient: 25 minutes     Past Medical History:  Past Medical History  Diagnosis Date  . Anxiety     was managed with BZDs until begining of May  . Insomnia   . Neck injury   . Back pain   . Pulmonary embolism (HCC) 2015    treated with warfarin for 6 months, reports it was found by accident  . PTSD (post-traumatic stress disorder)     lost brother to ARDS, father to Alzheimers  . Panic attacks   . Polysubstance abuse     reports narcotics 20 years ago, has been on methadone for 20 years, also admits marijuana   . Homelessness   . Pneumonia 2014    Past Surgical History  Procedure Laterality Date  . Tonsillectomy     Family History:  Family History  Problem Relation Age of Onset  . Cancer Mother   . Dementia Father     Social History:  History  Alcohol Use No     History  Drug Use No    Social History   Social History  . Marital Status: Single    Spouse Name: N/A  . Number of Children: N/A  . Years of Education: N/A   Social History Main Topics  . Smoking status: Former Smoker -- 0.15 packs/day for 10 years    Types: Cigarettes  . Smokeless tobacco: Never Used  . Alcohol Use: No  . Drug Use: No  . Sexual Activity:  Not Asked     Comment: patient smokes 1 cigarette a day   Other Topics Concern  . None   Social History Narrative   From [place], came to [place] to help fellow homeless person get home, was then kicked out by his mother and lost money and ID on walk from [place] to [place].  Plans to go back to [place] June 1st, 2016   Additional Social History:   Sleep: improving   Appetite:  Gradually improving   Current Medications: Current Facility-Administered Medications  Medication Dose Route Frequency Provider Last Rate Last Dose  . alum & mag  hydroxide-simeth (MAALOX/MYLANTA) 200-200-20 MG/5ML suspension 30 mL  30 mL Oral PRN Charm Rings, NP      . baclofen (LIORESAL) tablet 10 mg  10 mg Oral Once Court Joy, PA-C   10 mg at 11/20/15 8295  . butalbital-acetaminophen-caffeine (FIORICET, ESGIC) 50-325-40 MG per tablet 1 tablet  1 tablet Oral Once Oneta Rack, NP      . cephALEXin (KEFLEX) capsule 500 mg  500 mg Oral 3 times per day Charm Rings, NP   500 mg at 11/19/15 2104  . escitalopram (LEXAPRO) tablet 20 mg  20 mg Oral Daily Rockey Situ Cobos, MD   20 mg at 11/19/15 0800  . feeding supplement (ENSURE ENLIVE) (ENSURE ENLIVE) liquid 237 mL  237 mL Oral BID BM Rockey Situ Cobos, MD   237 mL at 11/19/15 1725  . gabapentin (NEURONTIN) capsule 300 mg  300 mg Oral q morning - 10a Craige Cotta, MD   300 mg at 11/18/15 0904  . gabapentin (NEURONTIN) capsule 600 mg  600 mg Oral QHS Craige Cotta, MD   600 mg at 11/19/15 2105  . hydrALAZINE (APRESOLINE) tablet 10 mg  10 mg Oral Q1200 Oneta Rack, NP   10 mg at 11/19/15 1100  . hydrOXYzine (ATARAX/VISTARIL) tablet 25 mg  25 mg Oral TID PRN Charm Rings, NP      . ibuprofen (ADVIL,MOTRIN) tablet 600 mg  600 mg Oral Q6H PRN Craige Cotta, MD   600 mg at 11/20/15 0402  . magnesium hydroxide (MILK OF MAGNESIA) suspension 30 mL  30 mL Oral Daily PRN Charm Rings, NP   30 mL at 11/18/15 0951  . nicotine (NICODERM CQ - dosed in mg/24 hours) patch 21 mg  21 mg Transdermal Daily Charm Rings, NP   21 mg at 11/19/15 1139  . ondansetron (ZOFRAN) tablet 4 mg  4 mg Oral Q8H PRN Charm Rings, NP      . temazepam (RESTORIL) capsule 15 mg  15 mg Oral QHS PRN Kerry Hough, PA-C   15 mg at 11/19/15 2104    Lab Results:  No results found for this or any previous visit (from the past 48 hour(s)).  Blood Alcohol level:  Lab Results  Component Value Date   ETH <5 11/12/2015   ETH <5 08/02/2015    Physical Findings: AIMS: Facial and Oral Movements Muscles of Facial  Expression: None, normal Lips and Perioral Area: None, normal Jaw: None, normal Tongue: None, normal,Extremity Movements Upper (arms, wrists, hands, fingers): None, normal Lower (legs, knees, ankles, toes): None, normal, Trunk Movements Neck, shoulders, hips: None, normal, Overall Severity Severity of abnormal movements (highest score from questions above): None, normal Incapacitation due to abnormal movements: None, normal Patient's awareness of abnormal movements (rate only patient's report): No Awareness, Dental Status Current problems with teeth and/or dentures?: No  Does patient usually wear dentures?: No  CIWA:  CIWA-Ar Total: 1 COWS:  COWS Total Score: 2  Musculoskeletal: Strength & Muscle Tone: within normal limits Gait & Station: normal Patient leans: N/A  Psychiatric Specialty Exam: Review of Systems  Neurological: Positive for headaches.  Psychiatric/Behavioral: Positive for depression and suicidal ideas. Negative for hallucinations. The patient is nervous/anxious and has insomnia.   All other systems reviewed and are negative.  denies headache, denies chest pain, denies SOB  Blood pressure 163/88, pulse 70, temperature 98.1 F (36.7 C), temperature source Oral, resp. rate 16, height 5\' 7"  (1.702 m), weight 55.792 kg (123 lb), SpO2 98 %.Body mass index is 19.26 kg/(m^2).  General Appearance:  Improved grooming, tearful, shaking and guarded.   Eye Contact::  Good  Speech:  Normal Rate  Volume:  Normal  Mood:  Remains depressed   Affect: still constricted, but more reactive than on admission   Thought Process:  Linear  Orientation:  Full (Time, Place, and Person)  Thought Content:  Rumination and denies hallucinations, no delusions   Suicidal Thoughts:  No at this time denies any plan or intention of SI or of hurting self on unit, contracts for safety on unit.   Homicidal Thoughts:  No  Memory:  recent and remote grossly intact   Judgement:  improved  Insight:   Improved   Psychomotor Activity:   More visible on unit   Concentration:  Good  Recall:  Good  Fund of Knowledge:Good  Language: Good  Akathisia:  Negative  Handed:  Right  AIMS (if indicated):     Assets:  Desire for Improvement Resilience  ADL's:   Fair   Cognition: WNL  Sleep:  Number of Hours: 5.75     I agree with current treatment plan on 11/20/2015, Patient seen face-to-face for psychiatric evaluation follow-up, chart reviewed. Reviewed the information documented and agree with the treatment plan.  Treatment Plan Summary: Daily contact with patient to assess and evaluate symptoms and progress in treatment, Medication management, Plan inpatient admission and medications as below  Continue to encourage group, milieu participation to work on coping skills and symptom reduction One time ordered placed for headache pain. Fioricet( 5-325-40mg  X 1 tablet) Start Apresoline 10 mg PO QD for HTN Continue  Lexapro to 20  mgrs QDAY for ongoing  depression and anxiety Continue Neurontin at 300 mgrs QAM and 600 mgrs QHS - for anxiety, pain, and insomnia  Continue Temazepam 15 mgrs QHS PRN for insomnia as needed  Continue Vistaril 25 mgrs Q 8 hours PRN for anxiety as needed  Start Ensure supplementation to help regain weight - patient states she  had lost significant weight over recent months   Oneta Rackanika N Lewis, NP 11/20/2015, 11:01 AM I agreed with findings and treatment plan of this patient

## 2015-11-20 NOTE — Plan of Care (Signed)
Problem: Alteration in mood Goal: STG-Patient reports thoughts of self-harm to staff Outcome: Progressing Patient reports SI that is on and off

## 2015-11-21 MED ORDER — GABAPENTIN 400 MG PO CAPS
800.0000 mg | ORAL_CAPSULE | Freq: Every day | ORAL | Status: DC
  Administered 2015-11-21 – 2015-11-25 (×5): 800 mg via ORAL
  Filled 2015-11-21 (×7): qty 2

## 2015-11-21 MED ORDER — GABAPENTIN 400 MG PO CAPS
400.0000 mg | ORAL_CAPSULE | Freq: Three times a day (TID) | ORAL | Status: DC
  Filled 2015-11-21 (×5): qty 1

## 2015-11-21 MED ORDER — CEPHALEXIN 500 MG PO CAPS
500.0000 mg | ORAL_CAPSULE | Freq: Two times a day (BID) | ORAL | Status: AC
  Administered 2015-11-21 – 2015-11-22 (×2): 500 mg via ORAL
  Filled 2015-11-21 (×2): qty 1

## 2015-11-21 MED ORDER — FLUOXETINE HCL 20 MG PO CAPS
20.0000 mg | ORAL_CAPSULE | Freq: Every day | ORAL | Status: DC
  Administered 2015-11-22 – 2015-11-23 (×2): 20 mg via ORAL
  Filled 2015-11-21 (×6): qty 1

## 2015-11-21 MED ORDER — GABAPENTIN 400 MG PO CAPS
400.0000 mg | ORAL_CAPSULE | Freq: Every day | ORAL | Status: DC
  Administered 2015-11-22 – 2015-11-26 (×5): 400 mg via ORAL
  Filled 2015-11-21 (×6): qty 1
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
## 2015-11-21 NOTE — Progress Notes (Signed)
Patient ID: [name], female   DOB: 03/06/1956, 60 y.o.   MRN: [id number]  DAR: Pt. Denies SI/HI and A/V Hallucinations. She reports sleep was fair last night, appetite is poor, energy level is low, and concentration is good. She rates depression and hopelessness 8/10, and anxiety 20/10. She continues to report some irritability but is more pleasant than yesterday. She appears at medication window and is able to stand without grabbing her head in pain. She rates pain 8/10 today in her neck but refuses intervention at this time. Support and encouragement provided to the patient. Writer offered scheduled medications however patient continues to refuse some. Treatment team made aware. Patient minimal and stays in her room mainly but is cooperative. Q15 minute checks are maintained for safety.

## 2015-11-21 NOTE — Progress Notes (Signed)
D: Pt presents with passive SI. Pt denies having the urge to harm herself while here at this time. Pt reports an improvement in her mood. " I feel better than yesterday". Pt is visibly active within the milieu. Pt denies any HI/AVH. A: Writer administered scheduled and prn medications to pt, per MD orders. Continued support and availability as needed was extended to this pt. Staff continues to monitor pt with q10515min checks.  R: No adverse drug reactions noted. Pt receptive to treatment. Pt verbally Pt remains safe at this time.

## 2015-11-21 NOTE — Progress Notes (Signed)
Adult Psychoeducational Group Note  Date:  11/21/2015 Time:  8:20 PM  Group Topic/Focus:  Wrap-Up Group:   The focus of this group is to help patients review their daily goal of treatment and discuss progress on daily workbooks.  Participation Level:  Active  Participation Quality:  Appropriate  Affect:  Appropriate  Cognitive:  Appropriate  Insight: Appropriate  Engagement in Group:  Engaged  Modes of Intervention:  Discussion  Additional Comments:  Pt was pleasant during wrap-up group. Pt rated her overall day a 7 out of 10 because she was feeling better today. Pt reported that she did not have a goal for the day.   Cleotilde NeerJasmine S Lallie Strahm 11/21/2015, 8:52 PM

## 2015-11-21 NOTE — [facility] Group Notes (Signed)
[facility] LCSW Group Therapy 11/21/2015  1:15 pm   Type of Therapy: Group Therapy Participation Level: Active  Participation Quality: Attentive, Sharing and Supportive  Affect: Blunted  Cognitive: Alert and Oriented  Insight: Developing/Improving and Engaged  Engagement in Therapy: Developing/Improving and Engaged  Modes of Intervention: Clarification, Confrontation, Discussion, Education, Exploration, Limit-setting, Orientation, Problem-solving, Rapport Building, Dance movement psychotherapisteality Testing, Socialization and Support  Summary of Progress/Problems: The topic for group was balance in life. Today's group focused on defining balance in one's own words, identifying things that can knock one off balance, and exploring healthy ways to maintain balance in life. Group members were asked to provide an example of a time when they felt off balance, describe how they handled that situation,and process healthier ways to regain balance in the future. Group members were asked to share the most important tool for maintaining balance that [facility] and how they plan to apply this method after discharge. Patient discussed homelessness, multiple deaths of family members, and lack of support as throwing her life out of balance. 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 She states that her goal is to make herself a priority moving forward by focusing on housing and "just putting one foot in front of the other". She identifies her spirituality as important to her. CSW and other group members provided patient with emotional support and encouragement.   Annette BruinKristin Lechelle Montgomery, MSW, LCSW Clinical Social Worker [facility] [phone]

## 2015-11-21 NOTE — Progress Notes (Signed)
Recreation Therapy Notes  Date: 03.27.2017 Time: 9:30am Location: [address] Group Room   Group Topic: Stress Management  Goal Area(s) Addresses:  Patient will actively participate in stress management techniques presented during session.   Behavioral Response: Did not attend.   Marrisa Kimber L Early Ord, LRT/CTRS         Nettye Flegal L 11/21/2015 12:56 PM 

## 2015-11-21 NOTE — [facility] Group Notes (Signed)
[facility]  11/21/2015  8:45 AM  Participation Quality: Did Not Attend. Patient invited to participate but declined.  Samuella BruinKristin Charo Philipp, MSW, LCSW Clinical Social Worker [facility] [phone]

## 2015-11-22 MED ORDER — CYCLOBENZAPRINE HCL 10 MG PO TABS: 5.0000 mg | ORAL_TABLET | Freq: Three times a day (TID) | ORAL | Status: DC | PRN

## 2015-11-22 MED ORDER — CYCLOBENZAPRINE HCL 10 MG PO TABS
5.0000 mg | ORAL_TABLET | Freq: Two times a day (BID) | ORAL | Status: DC | PRN
  Administered 2015-11-22 – 2015-11-25 (×6): 5 mg via ORAL
  Filled 2015-11-22 (×6): qty 1

## 2015-11-22 NOTE — Progress Notes (Addendum)
Patient ID: Annette Montgomery, female   DOB: 04/19/1956, 60 y.o.   MRN: [id number]  Pt currently presents with a flat affect and labile behavior. Per self inventory, pt rates depression at a 7, hopelessness 7 and anxiety 10. Pt's daily goal is to "no clue." Pt reports fair sleep, a fair appetite, low energy and good concentration. Pt reports increased agitation and irritability to writer today, says "I don't want to be around anyone, I'm going to go off." Pt reports delusions of grandeur including injuring her back sky diving, being best friends with Brandt LoosenRobin Williams widow, knowing the [employer] dead members, etc.   Pt provided with medications per providers orders. Pt's labs and vitals were monitored throughout the day. Pt supported emotionally and encouraged to express concerns and questions. Pt educated on medications and the importance of adhering to a medication regimen.   Pt's safety ensured with 15 minute and environmental checks. Pt currently denies SI/HI and A/V hallucinations. Pt verbally agrees to seek staff if SI/HI or A/VH occurs and to consult with staff before acting on these thoughts. Pt seen interacting with peers today. Pt reports about staff "they are great,  have been so kind." Will continue POC.

## 2015-11-22 NOTE — Progress Notes (Signed)
Adult Psychoeducational Group Note  Date:  11/22/2015 Time:  8:25 PM  Group Topic/Focus:  Wrap-Up Group:   The focus of this group is to help patients review their daily goal of treatment and discuss progress on daily workbooks.  Participation Level:  None  Participation Quality:  Attentive  Affect:  Irritable  Cognitive:  Did Not Participate  Insight: None  Engagement in Group:  None  Modes of Intervention:  Discussion  Additional Comments:  Pt was present at wrap-up group, however, she chose not to participate. Pt stated "you can skip me" when it was her turn to share about her day.   Annette NeerJasmine S Saksham Akkerman 11/22/2015, 9:02 PM

## 2015-11-22 NOTE — Tx Team (Signed)
Interdisciplinary Treatment Plan Update (Adult) Date: 11/22/2015    Time Reviewed: 9:30 AM  Progress in Treatment: Attending groups: Intermittently Participating in groups: Yes, when she attends Taking medication as prescribed: Yes Tolerating medication: Yes Family/Significant other contact made: No, patient has declined for family contact Patient understands diagnosis: Yes Discussing patient identified problems/goals with staff: Yes Medical problems stabilized or resolved: Yes Denies suicidal/homicidal ideation: Yes Issues/concerns per patient self-inventory: Yes Other:  New problem(s) identified: N/A  Discharge Plan or Barriers: CSW continuing to assess, patient new to milieu. Patient is homeless with no social supports. She declines to stay in a homeless shelter at discharge due to feeling unsafe there. She is interested in [facility] at discharge.  Reason for Continuation of Hospitalization:  Depression Anxiety Medication Stabilization   Comments: N/A  Estimated length of stay: 2-3 days    Patient is a 60 year old female with a diagnosis of Major Depressive Disorder and PTSD. Pt presented to the hospital with depression and suicidal ideations. Pt reports primary trigger(s) for admission was homelessness, financial stressors, lack of social supports. Patient will benefit from crisis stabilization, medication evaluation, group therapy and psycho education in addition to case management for discharge planning. At discharge, it is recommended that Pt remain compliant with established discharge plan and continued treatment   Review of initial/current patient goals per problem list:  1. Goal(s): Patient will participate in aftercare plan   Met: Goal progressing   Target date: 3-5 days post admission date   As evidenced by: Patient will participate within aftercare plan AEB aftercare provider and housing plan at discharge being identified.   3/23: Goal not met: CSW  assessing for appropriate referrals for pt and will have follow up secured prior to d/c.  3/28: Goal progressing. Patient is interested in discharging to [facility] in [place].   2. Goal (s): Patient will exhibit decreased depressive symptoms and suicidal ideations.   Met: Goal progressing   Target date: 3-5 days post admission date   As evidenced by: Patient will utilize self rating of depression at 3 or below and demonstrate decreased signs of depression or be deemed stable for discharge by MD.   3/22: Patient rates depression at 8.  3/28: Patient rates depression at 7.  3. Goal(s): Patient will demonstrate decreased signs and symptoms of anxiety.   Met: No   Target date: 3-5 days post admission date   As evidenced by: Patient will utilize self rating of anxiety at 3 or below and demonstrated decreased signs of anxiety, or be deemed stable for discharge by MD  3/23: Patient rates anxiety at 9.  3/28: Patient rates anxiety at 10.      Attendees: Patient:    Family:    Physician: Dr. Parke Poisson; Dr. Sabra Heck 11/22/2015 9:30 AM  Nursing: Grayland Ormond, Eulogio Bear, Anne Shutter, RN 11/22/2015 9:30 AM  Clinical Social Worker: Tilden Fossa, LCSW 11/22/2015 9:30 AM  Other: Peri Maris, LCSWA; [place], LCSW  11/22/2015 9:30 AM  Other: Lars Pinks, CM 11/22/2015 9:30 AM  Other:  11/22/2015 9:30 AM  Other: May Malachi Carl NP 11/22/2015 9:30 AM  Other:              Scribe for Treatment Team:  Tilden Fossa, [place]

## 2015-11-22 NOTE — Progress Notes (Signed)
Patient ID: [name], female   DOB: 04/23/1956, [age] y.o.   MRN: [id number]  Adult Psychoeducational Group Note  Date:  11/22/2015 Time: 09:00am  Group Topic/Focus:  Recovery Goals:   The focus of this group is to identify appropriate goals for recovery and establish a plan to achieve them.  Participation Level:  Did Not Attend  Participation Quality: n/a  Affect:  n/a  Cognitive: n/a  Insight: n/a  Engagement in Group: n/a  Modes of Intervention:  Activity, Discussion, Education and Support  Additional Comments:  Pt chose not to attend group, pt in bed asleep.  Aurora Maskwyman, Manpreet Kemmer E 11/22/2015, 11:02 AM

## 2015-11-22 NOTE — Progress Notes (Signed)
Recreation Therapy Notes  Animal-Assisted Activity (AAA) Program Checklist/Progress Notes Patient Eligibility Criteria Checklist & Daily Group note for Rec Tx Intervention  Date: 03.28.2017 Time: 2:45pm Location: [address]   AAA/T Program Assumption of Risk Form signed by Engineer, productionatient/ or Parent Legal Guardian Yes.    Patient is free of allergies or sever asthma Yes.    Patient reports no fear of animals Yes.    Patient reports no history of cruelty to animals Yes.    Patient understands his/her participation is voluntary Yes.    Behavioral Response: Did not attend.    Marykay Lexenise L Landa Mullinax, LRT/CTRS        Roxine Whittinghill L 11/22/2015 3:20 PM

## 2015-11-22 NOTE — Progress Notes (Signed)
Patient ID: Annette Montgomery, female   DOB: [birth year], 60 y.o.   MRN: [id number] [facility] MD Progress Note  11/22/2015 4:27 PM Akiya Morr  MRN:  [id number]   Subjective:Patient reports slight improvement . She describes ongoing depression, but states she is better overall . She describes some increased muscle aches, spasms today , but states " I don't think it is the Prozac, but it could be, but I want to give it a try a little longer ".    Objective : I have discussed case with treatment team and have met with patient . Reports ongoing depression, but states mood is getting " better slowly". States " I think I got a bed at [facility] in early April. " Reports this is helping her feel less severely anxious . She is future oriented and states she is thinking of eventually returning to [place]. Of note, chart notes indicate that patient has made statements regarding knowing /being friends with some famous people, leading to concern she may have some grandiose ideations, delusions . I have reviewed this with patient . She explains that she lived in a certain area near [place] for many years, and that it was a town where a lot of celebrities lived, and she did meet some of them and befriended some, particularly during 12 step meetings. States " I have lived a kind of interesting and adventurous life ". At this time no racing thoughts , no pressured speech, no expansive or irritable affect, and no evidence of psychosis or mania. No disruptive or agitated behaviors on unit . Going to some groups .   Principal Problem:  Depression  Diagnosis:   Patient Active Problem List   Diagnosis Date Noted  . Severe episode of recurrent major depressive disorder, without psychotic features ([place]) [F33.2]   . Major depressive disorder, recurrent episode, severe ([place]) [F33.2] 11/14/2015  . Major depressive disorder, recurrent episode, moderate ([place]) [F33.1] 11/13/2015  . Anxiety disorder [F41.9] 11/13/2015  .  Encounter for preadmission testing [Z01.818]   . Insomnia [G47.00]   . Drug-seeking behavior [F19.10] 08/16/2015  . HTN (hypertension) [I10] 07/19/2015  . Substance induced mood disorder ([place]) [F19.94] 06/20/2015  . COPD (chronic obstructive pulmonary disease) ([place]) [J44.9] 01/19/2015  . Tobacco abuse [Z72.0] 01/12/2015  . COPD exacerbation ([place]) [J44.1] 01/05/2015  . Anxiety [F41.9] 01/05/2015  . 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 Polysubstance abuse [F19.10] 12/30/2014  . Benzodiazepine abuse [F13.10] 12/30/2014  . Mood disorder ([place]) [F39] 11/29/2014   Total Time spent with patient: 25 minutes     Past Medical History:  Past Medical History  Diagnosis Date  . Anxiety     was managed with BZDs until begining of May  . Insomnia   . Neck injury   . Back pain   . Pulmonary embolism ([place]) 2015    treated with warfarin for 6 months, reports it was found by accident  . PTSD (post-traumatic stress disorder)     lost brother to ARDS, father to Alzheimers  . Panic attacks   . Polysubstance abuse     reports narcotics 20 years ago, has been on methadone for 20 years, also admits marijuana   . Homelessness   . Pneumonia 2014    Past Surgical History  Procedure Laterality Date  . Tonsillectomy     Family History:  Family History  Problem Relation Age of Onset  . Cancer Mother   . Dementia Father     Social History:  History  Alcohol Use No     History  Drug Use No    Social History   Social History  . Marital Status: Single    Spouse Name: N/A  . Number of Children: N/A  . Years of Education: N/A   Social History Main Topics  . Smoking status: Former Smoker -- 0.15 packs/day for 10 years    Types: Cigarettes  . Smokeless tobacco: Never Used  . Alcohol Use: No  . Drug Use: No  . Sexual Activity: Not Asked     Comment: patient smokes 1 cigarette a day   Other Topics Concern  . None   Social History Narrative   From [place], came to [place]  to help fellow homeless person get home, was then kicked out by his mother and lost money and ID on walk from [place] to [place].  Plans to go back to [place] June 1st, 2016   Additional Social History:   Sleep: improving   Appetite:  Gradually improving   Current Medications: Current Facility-Administered Medications  Medication Dose Route Frequency Provider Last Rate Last Dose  . alum & mag hydroxide-simeth (MAALOX/MYLANTA) 200-200-20 MG/5ML suspension 30 mL  30 mL Oral PRN Patrecia Pour, NP      . baclofen (LIORESAL) tablet 10 mg  10 mg Oral Once Dara Hoyer, PA-C   10 mg at 11/20/15 5681  . feeding supplement (ENSURE ENLIVE) (ENSURE ENLIVE) liquid 237 mL  237 mL Oral BID BM Myer Peer Cobos, MD   237 mL at 11/21/15 1428  . FLUoxetine (PROZAC) capsule 20 mg  20 mg Oral Daily Jenne Campus, MD   20 mg at 11/22/15 0809  . gabapentin (NEURONTIN) capsule 400 mg  400 mg Oral Q breakfast Jenne Campus, MD   400 mg at 11/22/15 0809  . gabapentin (NEURONTIN) capsule 800 mg  800 mg Oral QHS Jenne Campus, MD   800 mg at 11/21/15 2108  . hydrALAZINE (APRESOLINE) tablet 10 mg  10 mg Oral Q1200 Derrill Center, NP   10 mg at 11/19/15 1100  . hydrOXYzine (ATARAX/VISTARIL) tablet 25 mg  25 mg Oral TID PRN Patrecia Pour, NP      . ibuprofen (ADVIL,MOTRIN) tablet 600 mg  600 mg Oral Q6H PRN Jenne Campus, MD   600 mg at 11/22/15 1258  . magnesium hydroxide (MILK OF MAGNESIA) suspension 30 mL  30 mL Oral Daily PRN Patrecia Pour, NP   30 mL at 11/18/15 0951  . nicotine (NICODERM CQ - dosed in mg/24 hours) patch 21 mg  21 mg Transdermal Daily Patrecia Pour, NP   21 mg at 11/22/15 2751  . ondansetron (ZOFRAN) tablet 4 mg  4 mg Oral Q8H PRN Patrecia Pour, NP   4 mg at 11/20/15 1207  . temazepam (RESTORIL) capsule 15 mg  15 mg Oral QHS PRN Laverle Hobby, PA-C   15 mg at 11/21/15 2109    Lab Results:  No results found for this or any previous visit (from the past 48 hour(s)).  Blood  Alcohol level:  Lab Results  Component Value Date   ETH <5 11/12/2015   ETH <5 08/02/2015    Physical Findings: AIMS: Facial and Oral Movements Muscles of Facial Expression: None, normal Lips and Perioral Area: None, normal Jaw: None, normal Tongue: None, normal,Extremity Movements Upper (arms, wrists, hands, fingers): None, normal Lower (legs, knees, ankles, toes): None, normal, Trunk Movements Neck, shoulders, hips: None, normal, Overall Severity  Severity of abnormal movements (highest score from questions above): None, normal Incapacitation due to abnormal movements: None, normal Patient's awareness of abnormal movements (rate only patient's report): No Awareness, Dental Status Current problems with teeth and/or dentures?: No Does patient usually wear dentures?: No  CIWA:  CIWA-Ar Total: 1 COWS:  COWS Total Score: 2  Musculoskeletal: Strength & Muscle Tone: within normal limits Gait & Station: normal Patient leans: N/A  Psychiatric Specialty Exam: Review of Systems  Neurological: Positive for headaches.  Psychiatric/Behavioral: Positive for depression and suicidal ideas. Negative for hallucinations. The patient is nervous/anxious and has insomnia.   All other systems reviewed and are negative.  denies headache, denies chest pain, denies SOB  Blood pressure 158/88, pulse 62, temperature 98.4 F (36.9 C), temperature source Oral, resp. rate 16, height 5' 7" (1.702 m), weight 123 lb (55.792 kg), SpO2 98 %.Body mass index is 19.26 kg/(m^2).  General Appearance:  Improved grooming  Eye Contact::  Good  Speech:  Normal Rate  Volume:  Normal  Mood:  Less severely depressed, but states she still feels sad most of the time  Affect:  Less constricted, smiles appropriately at times   Thought Process:  Linear  Orientation:  Full (Time, Place, and Person)  Thought Content:   Less focused on negative ruminations , no psychotic symptoms  Suicidal Thoughts:  No at this time denies  any plan or intention of SI or of hurting self on unit, contracts for safety on unit.   Homicidal Thoughts:  No  Memory:  recent and remote grossly intact   Judgement:  improved  Insight:  Improved   Psychomotor Activity:   More visible on unit   Concentration:  Good  Recall:  Good  Fund of Knowledge:Good  Language: Good  Akathisia:  Negative  Handed:  Right  AIMS (if indicated):     Assets:  Desire for Improvement Resilience  ADL's:   Fair   Cognition: WNL  Sleep:  Number of Hours: 5.75   Assessment - patient remains sad, depressed, but has improved compared to admission. Today reports some diffuse  muscular aches, mainly shoulders, but appears calm , comfortable and not in any acute discomfort or pain, and was started on Prozac this AM. It is unlikely this is a side effect from Prozac. Will monitor. Patient more future oriented and focusing on going to [facility] in [place], [place] , on discharge and states she has been accepted there , with a bed for her in early April.  No current presentation or evidence of psychosis or mania   Treatment Plan Summary: Daily contact with patient to assess and evaluate symptoms and progress in treatment, Medication management, Plan inpatient admission and medications as below  Continue to encourage group, milieu participation to work on coping skills and symptom reduction Continue  Apresoline 10 mg PO QDAY for HTN Continue Prozac 20 mgrs QAM for depression and anxiety  Continue   Neurontin to 400 mgrs TID  - for anxiety, pain, and insomnia  Continue Temazepam 15 mgrs QHS PRN for insomnia as needed  Continue Vistaril 25 mgrs Q 8 hours PRN for anxiety as needed  Continue  Ensure supplementation to help regain weight  Treatment team working on disposition options, patient wanting to go to [facility] on discharge.  Neita Garnet, MD 11/22/2015, 4:27 PM I agreed with findings and treatment plan of this patient

## 2015-11-22 NOTE — [facility] Group Notes (Signed)
[facility] LCSW Group Therapy 11/22/2015  1:15 PM   Type of Therapy: Group Therapy  Participation Level: Did Not Attend. Patient invited to participate but declined.   Sricharan Lacomb, MSW, LCSW Clinical Social Worker  [facility] [phone]   

## 2015-11-23 NOTE — [facility] Group Notes (Signed)
[facility] LCSW Aftercare Discharge Planning Group Note  11/23/2015  8:45 AM  Participation Quality: Did Not Attend. Patient invited to participate but declined.  Dondrell Loudermilk, MSW, LCSW Clinical Social Worker [facility] [phone]   

## 2015-11-23 NOTE — Progress Notes (Signed)
Patient ID: Annette Montgomery, female   DOB: 02/24/1956, 60 y.o.   MRN: [id number] D: Client visible on the unit, requesting medications right at shift change "I been waiting since ten o'clock, but it's not your fault" Client is irritable and cantankerous. A: Museum/gallery conservatorWriter informed client writer shift started at seven pm, but medications were available. Medications reviewed, administered as ordered. Staff will monitor q4915min for safety. R: Client is safe on the unit.

## 2015-11-23 NOTE — Progress Notes (Signed)
Patient ID: Annette Montgomery, female   DOB: 06/10/1956, 60 y.o.   MRN: [id number]  DAR: Pt. Denies SI/HI and A/V Hallucinations. She reports sleep was poor, appetite is fair, and energy level is hyper. She rates her depression 4/10. Hopelessness 5/10, and anxiety 9/10. Patient continues to report pain in her neck and received PRN Ibuprofen for this. Non-pharmalogical interventions offered as well.  Support and encouragement provided to the patient. Scheduled medications administered to patient per physician's orders. Patient continues to refuse BP medication however BP remains elevated. Patient continues to report it is related to anxiety. Patient is seen more in the milieu today attending groups and speaking with peers. Q15 minute checks are maintained for safety.

## 2015-11-23 NOTE — Progress Notes (Signed)
Recreation Therapy Notes  Date: 03.29.2017 Time: 9:30am Location: 300 Hall Group Room   Group Topic: Stress Management  Goal Area(s) Addresses:  Patient will actively participate in stress management techniques presented during session.   Behavioral Response: Did not attend.  Lelon Ikard L Burnie Hank, LRT/CTRS        Mohamedamin Nifong L 11/23/2015 9:54 AM 

## 2015-11-23 NOTE — Progress Notes (Signed)
Patient ID: Annette Montgomery, female   DOB: 11-09-1955, 60 y.o.   MRN: [id number] [facility] MD Progress Note  11/23/2015 1:21 PM Annette Montgomery  MRN:  [id number]   Subjective:Patient reports improvement compared to admission , but reports ongoing depression and anxiety. States her mood is better, but she does report ongoing significant anxiety, particularly as she approaches discharge.  She is future oriented and focusing more on discharge issues . Denies medication side effects. She states she plans to go to [facility] as of early next week, but ruminates about whether to go to [place], where program is located, sooner , or on day of admission. Worries about financial issues, as she states " I won't have any money until the first of April".     Objective : I have discussed case with treatment team and have met with patient . Gradual and partial improvement of mood and range of affect, but does present improved compared to admission presentation . At this time more focused, as above, on discharge planning issues . Tolerating Neurontin and Prozac well . Has history of opiate dependence, but states she is not experiencing any cravings at this time and feels motivated in remaining sober. Visible on unit, going to some groups, cooperative on approach, behavior in good control.     Principal Problem:  Depression  Diagnosis:   Patient Active Problem List   Diagnosis Date Noted  . Severe episode of recurrent major depressive disorder, without psychotic features ([place]) [F33.2]   . Major depressive disorder, recurrent episode, severe ([place]) [F33.2] 11/14/2015  . Major depressive disorder, recurrent episode, moderate ([place]) [F33.1] 11/13/2015  . Anxiety disorder [F41.9] 11/13/2015  . Encounter for preadmission testing [Z01.818]   . Insomnia [G47.00]   . Drug-seeking behavior [F19.10] 08/16/2015  . HTN (hypertension) [I10] 07/19/2015  . Substance induced mood disorder ([place]) [F19.94]  06/20/2015  . COPD (chronic obstructive pulmonary disease) ([place]) [J44.9] 01/19/2015  . Tobacco abuse [Z72.0] 01/12/2015  . COPD exacerbation ([place]) [J44.1] 01/05/2015  . Anxiety [F41.9] 01/05/2015  . 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 Polysubstance abuse [F19.10] 12/30/2014  . Benzodiazepine abuse [F13.10] 12/30/2014  . Mood disorder ([place]) [F39] 11/29/2014   Total Time spent with patient: 20 minutes     Past Medical History:  Past Medical History  Diagnosis Date  . Anxiety     was managed with BZDs until begining of May  . Insomnia   . Neck injury   . Back pain   . Pulmonary embolism ([place]) 2015    treated with warfarin for 6 months, reports it was found by accident  . PTSD (post-traumatic stress disorder)     lost brother to ARDS, father to Alzheimers  . Panic attacks   . Polysubstance abuse     reports narcotics 20 years ago, has been on methadone for 20 years, also admits marijuana   . Homelessness   . Pneumonia 2014    Past Surgical History  Procedure Laterality Date  . Tonsillectomy     Family History:  Family History  Problem Relation Age of Onset  . Cancer Mother   . Dementia Father     Social History:  History  Alcohol Use No     History  Drug Use No    Social History   Social History  . Marital Status: Single    Spouse Name: N/A  . Number of Children: N/A  . Years of Education: N/A   Social History Main Topics  .  Smoking status: Former Smoker -- 0.15 packs/day for 10 years    Types: Cigarettes  . Smokeless tobacco: Never Used  . Alcohol Use: No  . Drug Use: No  . Sexual Activity: Not Asked     Comment: patient smokes 1 cigarette a day   Other Topics Concern  . None   Social History Narrative   From [place], came to [place] to help fellow homeless person get home, was then kicked out by his mother and lost money and ID on walk from [place] to [place].  Plans to go back to [place] June 1st, 2016   Additional Social History:    Sleep: improving   Appetite:  Gradually improving   Current Medications: Current Facility-Administered Medications  Medication Dose Route Frequency Provider Last Rate Last Dose  . alum & mag hydroxide-simeth (MAALOX/MYLANTA) 200-200-20 MG/5ML suspension 30 mL  30 mL Oral PRN Patrecia Pour, NP      . baclofen (LIORESAL) tablet 10 mg  10 mg Oral Once Dara Hoyer, PA-C   10 mg at 11/20/15 1829  . cyclobenzaprine (FLEXERIL) tablet 5 mg  5 mg Oral Q12H PRN Jenne Campus, MD   5 mg at 11/23/15 9371  . feeding supplement (ENSURE ENLIVE) (ENSURE ENLIVE) liquid 237 mL  237 mL Oral BID BM Myer Peer Cobos, MD   237 mL at 11/23/15 0759  . FLUoxetine (PROZAC) capsule 20 mg  20 mg Oral Daily Jenne Campus, MD   20 mg at 11/23/15 0755  . gabapentin (NEURONTIN) capsule 400 mg  400 mg Oral Q breakfast Jenne Campus, MD   400 mg at 11/23/15 0755  . gabapentin (NEURONTIN) capsule 800 mg  800 mg Oral QHS Myer Peer Cobos, MD   800 mg at 11/22/15 2121  . hydrALAZINE (APRESOLINE) tablet 10 mg  10 mg Oral Q1200 Derrill Center, NP   10 mg at 11/19/15 1100  . hydrOXYzine (ATARAX/VISTARIL) tablet 25 mg  25 mg Oral TID PRN Patrecia Pour, NP      . ibuprofen (ADVIL,MOTRIN) tablet 600 mg  600 mg Oral Q6H PRN Jenne Campus, MD   600 mg at 11/22/15 1258  . magnesium hydroxide (MILK OF MAGNESIA) suspension 30 mL  30 mL Oral Daily PRN Patrecia Pour, NP   30 mL at 11/18/15 0951  . nicotine (NICODERM CQ - dosed in mg/24 hours) patch 21 mg  21 mg Transdermal Daily Patrecia Pour, NP   21 mg at 11/23/15 0756  . ondansetron (ZOFRAN) tablet 4 mg  4 mg Oral Q8H PRN Patrecia Pour, NP   4 mg at 11/20/15 1207  . temazepam (RESTORIL) capsule 15 mg  15 mg Oral QHS PRN Laverle Hobby, PA-C   15 mg at 11/22/15 2121    Lab Results:  No results found for this or any previous visit (from the past 48 hour(s)).  Blood Alcohol level:  Lab Results  Component Value Date   ETH <5 11/12/2015   ETH <5 08/02/2015     Physical Findings: AIMS: Facial and Oral Movements Muscles of Facial Expression: None, normal Lips and Perioral Area: None, normal Jaw: None, normal Tongue: None, normal,Extremity Movements Upper (arms, wrists, hands, fingers): None, normal Lower (legs, knees, ankles, toes): None, normal, Trunk Movements Neck, shoulders, hips: None, normal, Overall Severity Severity of abnormal movements (highest score from questions above): None, normal Incapacitation due to abnormal movements: None, normal Patient's awareness of abnormal movements (rate only patient's report): No  Awareness, Dental Status Current problems with teeth and/or dentures?: No Does patient usually wear dentures?: No  CIWA:  CIWA-Ar Total: 1 COWS:  COWS Total Score: 2  Musculoskeletal: Strength & Muscle Tone: within normal limits Gait & Station: normal Patient leans: N/A  Psychiatric Specialty Exam: Review of Systems  Neurological: Positive for headaches.  Psychiatric/Behavioral: Positive for depression and suicidal ideas. Negative for hallucinations. The patient is nervous/anxious and has insomnia.   All other systems reviewed and are negative.  denies headache, denies chest pain, denies SOB  Blood pressure 137/78, pulse 69, temperature 98.5 F (36.9 C), temperature source Oral, resp. rate 16, height '5\' 7"'  (1.702 m), weight 123 lb (55.792 kg), SpO2 98 %.Body mass index is 19.26 kg/(m^2).  General Appearance:  Improved grooming  Eye Contact::  Good  Speech:  Normal Rate  Volume:  Normal  Mood:  Gradually improving mood, remains vaguely constricted, anxious   Affect:  Less constricted, anxious   Thought Process:  Linear  Orientation:  Full (Time, Place, and Person)  Thought Content:   Less focused on negative ruminations , better able to focus on discharge planning ,  no psychotic symptoms  Suicidal Thoughts:  No at this time denies any plan or intention of SI or of hurting self on unit, contracts for safety on  unit.   Homicidal Thoughts:  No  Memory:  recent and remote grossly intact   Judgement:  improved  Insight:  Improved   Psychomotor Activity:   More visible on unit   Concentration:  Good  Recall:  Good  Fund of Knowledge:Good  Language: Good  Akathisia:  Negative  Handed:  Right  AIMS (if indicated):     Assets:  Desire for Improvement Resilience  ADL's:   Fair   Cognition: WNL  Sleep:  Number of Hours: 5.25   Assessment - patient remains depressed, but affect more reactive. As she improves she is more focused on discharge options and expressing increased anxiety. She is planning on going to [facility] in [place], [place], and has a bed available as of early next week . Tolerating medications well .  Treatment Plan Summary: Daily contact with patient to assess and evaluate symptoms and progress in treatment, Medication management, Plan inpatient admission and medications as below  Continue to encourage group, milieu participation to work on coping skills and symptom reduction Continue  Apresoline 10 mg PO QDAY for HTN Continue Prozac 20 mgrs QAM for depression and anxiety  Continue   Neurontin to 400 mgrs TID  - for anxiety, pain, and insomnia  Continue Temazepam 15 mgrs QHS PRN for insomnia as needed  Continue Vistaril 25 mgrs Q 8 hours PRN for anxiety as needed  Continue  Ensure supplementation to help regain weight  Treatment team working on disposition options, patient wanting to go to [facility] on discharge.  Neita Garnet, MD 11/23/2015, 1:21 PM

## 2015-11-23 NOTE — [facility] Group Notes (Signed)
[facility] LCSW Group Therapy 11/23/2015  1:15 PM Type of Therapy: Group Therapy Participation Level: Active  Participation Quality: Attentive, Sharing and Supportive  Affect: Appropriate  Cognitive: Alert and Oriented  Insight: Developing/Improving and Engaged  Engagement in Therapy: Developing/Improving and Engaged  Modes of Intervention: Clarification, Confrontation, Discussion, Education, Exploration, Limit-setting, Orientation, Problem-solving, Rapport Building, Dance movement psychotherapisteality Testing, Socialization and Support  Summary of Progress/Problems: The topic for group today was emotional regulation. This group focused on both positive and negative emotion identification and allowed group members to process ways to identify feelings, regulate negative emotions, and find healthy ways to manage internal/external emotions. Group members were asked to reflect on a time when their reaction to an emotion led to a negative outcome and explored how alternative responses using emotion regulation would have benefited them. Group members were also asked to discuss a time when emotion regulation was utilized when a negative emotion was experienced. Patient participated in discussion of emotions that are difficult to handle and provided emotional support to another patient struggling with hopelessness.  Samuella BruinKristin Jamell Laymon, MSW, LCSW Clinical Social Worker [facility] [phone]

## 2015-11-24 MED ORDER — FLUOXETINE HCL 10 MG PO CAPS
10.0000 mg | ORAL_CAPSULE | Freq: Every day | ORAL | Status: DC
  Administered 2015-11-25 – 2015-11-26 (×2): 10 mg via ORAL
  Filled 2015-11-24 (×3): qty 1

## 2015-11-24 NOTE — Progress Notes (Addendum)
Patient ID: Annette Montgomery, female   DOB: 07/27/1956, 60 y.o.   MRN: [id number] [facility] MD Progress Note  11/24/2015 4:35 PM Annette Montgomery  MRN:  [id number]   Subjective:  Patient reports that she feels Prozac has helped her mood and " I feel like it has gotten me out of the depression I was in". She does, however, feel like Prozac is making her somewhat anxious, and feels it is causing her some insomnia, because " since I started it I have not been sleeping as well ". She does not want to stop it or change it because " it is helping ", but wants to cut down dose. Of note, we reviewed history again   and she is not endorsing  history of bipolar disorder     Objective : I have discussed case with treatment team and have met with patient . Patient is presenting with improved mood and range of affect. Somewhat apprehensive about upcoming discharge, but states " I am resilient, I know I'll be OK". Remains future oriented, and her plan is to relocate to [place], [place] soon and to enter [place] next week. She is visible in day room, going to groups, no agitation or irritability noted. As above, feels Prozac is helping but worries that it may be causing some insomnia.     Principal Problem:  Depression  Diagnosis:   Patient Active Problem List   Diagnosis Date Noted  . Severe episode of recurrent major depressive disorder, without psychotic features ([place]) [F33.2]   . Major depressive disorder, recurrent episode, severe ([place]) [F33.2] 11/14/2015  . Major depressive disorder, recurrent episode, moderate ([place]) [F33.1] 11/13/2015  . Anxiety disorder [F41.9] 11/13/2015  . Encounter for preadmission testing [Z01.818]   . Insomnia [G47.00]   . Drug-seeking behavior [F19.10] 08/16/2015  . HTN (hypertension) [I10] 07/19/2015  . Substance induced mood disorder ([place]) [F19.94] 06/20/2015  . COPD (chronic obstructive pulmonary disease) ([place]) [J44.9] 01/19/2015  . Tobacco abuse  [Z72.0] 01/12/2015  . COPD exacerbation ([place]) [J44.1] 01/05/2015  . Anxiety [F41.9] 01/05/2015  . 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 Polysubstance abuse [F19.10] 12/30/2014  . Benzodiazepine abuse [F13.10] 12/30/2014  . Mood disorder ([place]) [F39] 11/29/2014   Total Time spent with patient: 20 minutes     Past Medical History:  Past Medical History  Diagnosis Date  . Anxiety     was managed with BZDs until begining of May  . Insomnia   . Neck injury   . Back pain   . Pulmonary embolism ([place]) 2015    treated with warfarin for 6 months, reports it was found by accident  . PTSD (post-traumatic stress disorder)     lost brother to ARDS, father to Alzheimers  . Panic attacks   . Polysubstance abuse     reports narcotics 20 years ago, has been on methadone for 20 years, also admits marijuana   . Homelessness   . Pneumonia 2014    Past Surgical History  Procedure Laterality Date  . Tonsillectomy     Family History:  Family History  Problem Relation Age of Onset  . Cancer Mother   . Dementia Father     Social History:  History  Alcohol Use No     History  Drug Use No    Social History   Social History  . Marital Status: Single    Spouse Name: N/A  . Number of Children: N/A  . Years of Education: N/A  Social History Main Topics  . Smoking status: Former Smoker -- 0.15 packs/day for 10 years    Types: Cigarettes  . Smokeless tobacco: Never Used  . Alcohol Use: No  . Drug Use: No  . Sexual Activity: Not Asked     Comment: patient smokes 1 cigarette a day   Other Topics Concern  . None   Social History Narrative   From [place], came to [place] to help fellow homeless person get home, was then kicked out by his mother and lost money and ID on walk from [place] to [place].  Plans to go back to [place] June 1st, 2016   Additional Social History:   Sleep: fair    Appetite:  Improved   Current Medications: Current Facility-Administered  Medications  Medication Dose Route Frequency Provider Last Rate Last Dose  . alum & mag hydroxide-simeth (MAALOX/MYLANTA) 200-200-20 MG/5ML suspension 30 mL  30 mL Oral PRN Patrecia Pour, NP      . baclofen (LIORESAL) tablet 10 mg  10 mg Oral Once Dara Hoyer, PA-C   10 mg at 11/20/15 0488  . cyclobenzaprine (FLEXERIL) tablet 5 mg  5 mg Oral Q12H PRN Jenne Campus, MD   5 mg at 11/24/15 0917  . feeding supplement (ENSURE ENLIVE) (ENSURE ENLIVE) liquid 237 mL  237 mL Oral BID BM Myer Peer Cobos, MD   237 mL at 11/24/15 1000  . FLUoxetine (PROZAC) capsule 20 mg  20 mg Oral Daily Jenne Campus, MD   20 mg at 11/23/15 0755  . gabapentin (NEURONTIN) capsule 400 mg  400 mg Oral Q breakfast Jenne Campus, MD   400 mg at 11/24/15 0815  . gabapentin (NEURONTIN) capsule 800 mg  800 mg Oral QHS Jenne Campus, MD   800 mg at 11/23/15 2108  . hydrALAZINE (APRESOLINE) tablet 10 mg  10 mg Oral Q1200 Derrill Center, NP   10 mg at 11/24/15 1128  . hydrOXYzine (ATARAX/VISTARIL) tablet 25 mg  25 mg Oral TID PRN Patrecia Pour, NP      . ibuprofen (ADVIL,MOTRIN) tablet 600 mg  600 mg Oral Q6H PRN Jenne Campus, MD   600 mg at 11/24/15 1627  . magnesium hydroxide (MILK OF MAGNESIA) suspension 30 mL  30 mL Oral Daily PRN Patrecia Pour, NP   30 mL at 11/18/15 0951  . nicotine (NICODERM CQ - dosed in mg/24 hours) patch 21 mg  21 mg Transdermal Daily Patrecia Pour, NP   21 mg at 11/24/15 8916  . ondansetron (ZOFRAN) tablet 4 mg  4 mg Oral Q8H PRN Patrecia Pour, NP   4 mg at 11/20/15 1207  . temazepam (RESTORIL) capsule 15 mg  15 mg Oral QHS PRN Laverle Hobby, PA-C   15 mg at 11/23/15 2108    Lab Results:  No results found for this or any previous visit (from the past 48 hour(s)).  Blood Alcohol level:  Lab Results  Component Value Date   ETH <5 11/12/2015   ETH <5 08/02/2015    Physical Findings: AIMS: Facial and Oral Movements Muscles of Facial Expression: None, normal Lips and  Perioral Area: None, normal Jaw: None, normal Tongue: None, normal,Extremity Movements Upper (arms, wrists, hands, fingers): None, normal Lower (legs, knees, ankles, toes): None, normal, Trunk Movements Neck, shoulders, hips: None, normal, Overall Severity Severity of abnormal movements (highest score from questions above): None, normal Incapacitation due to abnormal movements: None, normal Patient's awareness of abnormal  movements (rate only patient's report): No Awareness, Dental Status Current problems with teeth and/or dentures?: No Does patient usually wear dentures?: No  CIWA:  CIWA-Ar Total: 1 COWS:  COWS Total Score: 2  Musculoskeletal: Strength & Muscle Tone: within normal limits Gait & Station: normal Patient leans: N/A  Psychiatric Specialty Exam: Review of Systems  Neurological: Positive for headaches.  Psychiatric/Behavioral: Positive for depression and suicidal ideas. Negative for hallucinations. The patient is nervous/anxious and has insomnia.   All other systems reviewed and are negative.  denies headache, denies chest pain, denies SOB  Blood pressure 151/93, pulse 71, temperature 98.7 F (37.1 C), temperature source Oral, resp. rate 14, height '5\' 7"'  (1.702 m), weight 123 lb (55.792 kg), SpO2 98 %.Body mass index is 19.26 kg/(m^2).  General Appearance:  Improved grooming  Eye Contact::  Good  Speech:  Normal Rate  Volume:  Normal  Mood:  Improved mood and at this time reports mood is " much better"   Affect: some anxiety,  More reactive   Thought Process:  Linear  Orientation:  Full (Time, Place, and Person)  Thought Content:    no psychotic symptoms  Suicidal Thoughts:  No at this time denies any plan or intention of SI or of hurting self on unit, contracts for safety on unit.   Homicidal Thoughts:  No  Memory:  recent and remote grossly intact   Judgement:  improved  Insight:  Improved   Psychomotor Activity:   More visible on unit   Concentration:  Good   Recall:  Good  Fund of Knowledge:Good  Language: Good  Akathisia:  Negative  Handed:  Right  AIMS (if indicated):     Assets:  Desire for Improvement Resilience  ADL's:  Good   Cognition: WNL  Sleep:  Number of Hours: 5.25   Assessment - patient's mood and affect improved, less depressed, more future oriented, looking to going to [place], [place] after discharge and entering a Residential Setting there. Tolerating Prozac well, but feels it is causing some insomnia and  Subjective sense of anxiety/ agitation. She does not want to stop it , however, as she feels it is helping her mood . Of note, does not endorse history of bipolar disorder and is not presenting with any symptoms suggestive of hypomania at this time .   Treatment Plan Summary: Daily contact with patient to assess and evaluate symptoms and progress in treatment, Medication management, Plan inpatient admission and medications as below  Continue to encourage group, milieu participation to work on coping skills and symptom reduction Continue  Apresoline 10 mg PO QDAY for HTN Decrease Prozac to 10  mgrs QAM for depression and anxiety  Continue   Neurontin 400 mgrs QAM and 800 mgrs QHS   - for anxiety, pain, and insomnia  Continue Temazepam 15 mgrs QHS PRN for insomnia as needed  Continue Vistaril 25 mgrs Q 8 hours PRN for anxiety as needed  Continue  Ensure supplementation to help regain weight  Treatment team working on disposition options, patient wanting to go to [employer] [facility] on discharge.  Neita Garnet, MD 11/24/2015, 4:35 PM

## 2015-11-24 NOTE — Progress Notes (Signed)
Patient ID: Annette Montgomery, female   DOB: 03/08/1956, 60 y.o.   MRN: [id number]  Adult Psychoeducational Group Note  Date:  11/24/2015 Time: 09:00am  Group Topic/Focus:  Making Healthy Choices:   The focus of this group is to help patients identify negative/unhealthy choices they were using prior to admission and identify positive/healthier coping strategies to replace them upon discharge.  Participation Level:  Active  Participation Quality:  Appropriate  Affect:  Blunted  Cognitive:  Alert and Oriented  Insight: Improving  Engagement in Group:  Improving  Modes of Intervention:  Discussion, Education and Support  Additional Comments:    Aurora Maskwyman, Baljit Liebert E 11/24/2015, 9:43 AM

## 2015-11-24 NOTE — Progress Notes (Signed)
Patient ID: Annette Montgomery, female   DOB: 09/09/1955, 60 y.o.   MRN: [id number]   Pt currently presents with a blunted affect and anxious behavior. Per self inventory, pt rates depression at a 7, hopelessness 7 and anxiety 10. Pt's daily goal is to "staying in my body" and they intend to do so by "being aware of my anxiety." Pt reports poor sleep, a fair appetite, hyper energy and poor concentration. Pt preoccupied with her medications, including her Prozac. Pt refuses morning dose until "the doctor cuts it in half."   Pt provided with medications per providers orders. Pt's labs and vitals were monitored throughout the day. Pt supported emotionally and encouraged to express concerns and questions. Pt educated on medications.  Pt's safety ensured with 15 minute and environmental checks. Pt currently denies SI/HI and A/V hallucinations. Pt verbally agrees to seek staff if SI/HI or A/VH occurs and to consult with staff before acting on these thoughts. Will continue POC.

## 2015-11-24 NOTE — Progress Notes (Signed)
D: Patient observed in milieu with peers. Patient states she did not have a goal for the day. Patient states that she has had medication adjustments since she has been here. Patient voiced no concerns at this time. A: Support and encouragement offered. Q 15 minute checks in progress and maintained. R: Patient remains safe on unit.

## 2015-11-24 NOTE — [facility] Group Notes (Signed)
[facility] LCSW Group Therapy 11/24/2015 1:15 PM Type of Therapy: Group Therapy Participation Level: Active  Participation Quality: Attentive, Sharing and Supportive  Affect: Blunted  Cognitive: Alert and Oriented  Insight: Developing/Improving and Engaged  Engagement in Therapy: Developing/Improving and Engaged  Modes of Intervention: Activity, Clarification, Confrontation, Discussion, Education, Exploration, Limit-setting, Orientation, Problem-solving, Rapport Building, Reality Testing, Socialization and Support  Summary of Progress/Problems: Patient was attentive and engaged with speaker from [employer]. Patient was attentive to speaker while they shared their story of dealing with mental health and overcoming it. Patient expressed interest in their programs and services and received information on their agency. Patient processed ways they can relate to the speaker.   Danity Schmelzer, LCSW Clinical Social Worker [facility] [phone]   

## 2015-11-24 NOTE — Progress Notes (Signed)
Nutrition Education Note  Pt attended group focusing on general, healthful nutrition education.  RD emphasized the importance of eating regular meals and snacks throughout the day. Consuming sugar-free beverages and incorporating fruits and vegetables into diet when possible. Provided examples of healthy snacks. Patient encouraged to leave group with a goal to improve nutrition/healthy eating.   Diet Order: Diet Heart Room service appropriate?: Yes; Fluid consistency:: Thin Pt is also offered choice of unit snacks mid-morning and mid-afternoon.  Pt is eating as desired.   If additional nutrition issues arise, please consult RD.   Chaunte Hornbeck, RD, LDN Inpatient Clinical Dietitian Pager # [phone] After hours/weekend pager # [phone]     

## 2015-11-25 MED ORDER — TEMAZEPAM 15 MG PO CAPS
15.0000 mg | ORAL_CAPSULE | Freq: Once | ORAL | Status: AC
  Administered 2015-11-25: 15 mg via ORAL
  Filled 2015-11-25: qty 1

## 2015-11-25 NOTE — Progress Notes (Signed)
D: Patient reports discharging tomorrow. Pt reports plans to sleep outside in a tent till her check comes in on Saturday. Pt stated refusing to go to the shelter because of what happen PTA. Denies  SI/HI/AVH and pain. A: Support and encouragement offered as needed. Medications administered as prescribed.  R: Patient cooperative and appropriate on unit. Will continue to monitor patient for safety and stability.

## 2015-11-25 NOTE — Progress Notes (Signed)
Patient ID: Annette Montgomery, female   DOB: 03/18/1956, 60 y.o.   MRN: [id number]   Pt currently presents with a pleasant affect and cooperative behavior. Per self inventory, pt rates depression at a 7 and anxiety at a 10.  Pt's daily goal is to "discharge and find a place to go out of [place]" and they intend to do so by "no clue." Pt reports poor sleep, a poor appetite, low energy and good concentration. Pt attends groups, interacts positively with roommate.   Pt provided with medications per providers orders. Pt's labs and vitals were monitored throughout the day. Pt supported emotionally and encouraged to express concerns and questions. Pt educated on medications.  Pt's safety ensured with 15 minute and environmental checks. Pt currently denies SI/HI and A/V hallucinations. Pt verbally agrees to seek staff if SI/HI or A/VH occurs and to consult with staff before acting on these thoughts. Pt to be discharged today if shelter bed is found. Pt writes, "Thank you for awesome care and understanding." Will continue POC.

## 2015-11-25 NOTE — Progress Notes (Signed)
Adult Psychoeducational Group Note  Date:  11/25/2015 Time:  8:48 PM  Group Topic/Focus:  Wrap-Up Group:   The focus of this group is to help patients review their daily goal of treatment and discuss progress on daily workbooks.  Participation Level:  Did Not Attend  Participation Quality:  Did not attend  Affect:  Did not attend  Cognitive:  Did not attend  Insight: None  Engagement in Group:  Did not attend  Modes of Intervention:  Did not attend  Additional Comments:  Patient did not attend wrap up group tonight.   Beckham Capistran L Shery [place] 11/25/2015, 8:48 PM

## 2015-11-25 NOTE — Progress Notes (Signed)
D: Patient observed in room reading a book. Patient states her goal for the day was to "work on her anxiety." Patient knowledgeable about plan of care. Support offered. A: Support and encouragement offered. Q 15 minute checks in progress and maintained for safety. R: Patient remains safe on unit.

## 2015-11-25 NOTE — [facility] Group Notes (Signed)
[facility] LCSW Group Therapy 11/25/2015 1:15pm  Type of Therapy: Group Therapy- Feelings Around Relapse and Recovery  Pt did not attend, declined invitation.   Leonel Mccollum Carter, LCSWA [phone] 11/25/2015 5:13 PM  
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
## 2015-11-25 NOTE — [facility] Group Notes (Signed)
[facility] L[facility] Discharge Planning [facility] Note  11/25/2015 8:45 AM  Participation Quality: Alert, Appropriate and Oriented  Mood/Affect: Anxious  Depression Rating: 6  Anxiety Rating: "high"  Thoughts of Suicide: Pt denies SI/HI  Will you contract for safety? Yes  Current AVH: Pt denies  Plan for Discharge/Comments: Pt attended discharge planning group and actively participated in group. CSW discussed suicide prevention education with the group and encouraged them to discuss discharge planning and any relevant barriers. Pt reports being anxious about discharge due to weather forecast. Pt continues to report poor sleep, despite medication adjustments.  Transportation Means: Pt reports access to transportation  Supports: No supports mentioned at this time  Chad CordialLauren Carter, LCSWA 11/25/2015 9:20 AM

## 2015-11-25 NOTE — Tx Team (Signed)
Interdisciplinary Treatment Plan Update (Adult) Date: 11/25/2015    Time Reviewed: 9:30 AM  Progress in Treatment: Attending groups: Intermittently Participating in groups: Yes, when she attends Taking medication as prescribed: Yes Tolerating medication: Yes Family/Significant other contact made: No, patient has declined for family contact Patient understands diagnosis: Yes Discussing patient identified problems/goals with staff: Yes Medical problems stabilized or resolved: Yes Denies suicidal/homicidal ideation: Yes Issues/concerns per patient self-inventory: Yes Other:  New problem(s) identified: N/A  Discharge Plan or Barriers: CSW continuing to assess, patient new to milieu. Patient is homeless with no social supports. She declines to stay in a homeless shelter at discharge due to feeling unsafe there. She is interested in [facility] at discharge.  11/25/15: Pt will discharge Saturday and travel to [place]; bed at [facility] on 4/3.  Reason for Continuation of Hospitalization:  Depression Anxiety Medication Stabilization   Comments: N/A  Estimated length of stay: 1 day    Patient is a 60 year old female with a diagnosis of Major Depressive Disorder and PTSD. Pt presented to the hospital with depression and suicidal ideations. Pt reports primary trigger(s) for admission was homelessness, financial stressors, lack of social supports. Patient will benefit from crisis stabilization, medication evaluation, group therapy and psycho education in addition to case management for discharge planning. At discharge, it is recommended that Pt remain compliant with established discharge plan and continued treatment   Review of initial/current patient goals per problem list:  1. Goal(s): Patient will participate in aftercare plan   Met: Yes   Target date: 3-5 days post admission date   As evidenced by: Patient will participate within aftercare plan AEB aftercare  provider and housing plan at discharge being identified.   3/23: Goal not met: CSW assessing for appropriate referrals for pt and will have follow up secured prior to d/c.  3/28: Goal progressing. Patient is interested in discharging to [facility] in [place].  3/31: Pt will discharge Saturday and travel to [place]; bed at [facility] on 4/3. Will [facility] in [place]   2. Goal (s): Patient will exhibit decreased depressive symptoms and suicidal ideations.   Met: Adequate for DC   Target date: 3-5 days post admission date   As evidenced by: Patient will utilize self rating of depression at 3 or below and demonstrate decreased signs of depression or be deemed stable for discharge by MD.   3/22: Patient rates depression at 8.  3/28: Patient rates depression at 7.  3/31: MD feels that Pt's symptoms have decreased to the point that they can be managed in an outpatient setting.   3. Goal(s): Patient will demonstrate decreased signs and symptoms of anxiety.   Met: Adequate for DC.    Target date: 3-5 days post admission date   As evidenced by: Patient will utilize self rating of anxiety at 3 or below and demonstrated decreased signs of anxiety, or be deemed stable for discharge by MD  3/23: Patient rates anxiety at 9.  3/28: Patient rates anxiety at 10.  3/31: MD feels that Pt's symptoms have decreased to the point that they can be managed in an outpatient setting.       Attendees: Patient:    Family:    Physician: Dr. Parke Poisson; Dr. Sabra Heck 11/25/2015 9:30 AM  Nursing: Marcella Dubs, RN; Vira Browns, RN 11/25/2015 9:30 AM  Clinical Social Worker: Tilden Fossa, LCSW 11/25/2015 9:30 AM  Other: Peri Maris, LCSWA; [place], LCSW  11/25/2015 9:30 AM  Other: Anderson [place]  Clark, CM 11/25/2015 9:30 AM  Other:  11/25/2015 9:30 AM  Other: [facility] NP 11/25/2015 9:30 AM  Other:              Scribe for Treatment  Team:  Peri Maris, [place] Work [phone]

## 2015-11-25 NOTE — Progress Notes (Signed)
Recreation Therapy Notes  Date: 03.31.2017 Time: 9:30am Location: [address] Group Room   Group Topic: Stress Management  Goal Area(s) Addresses:  Patient will actively participate in stress management techniques presented during session.   Behavioral Response: Did not attend.   Marykay Lexenise L Cavion Faiola, LRT/CTRS        Janki Dike L 11/25/2015 2:04 PM

## 2015-11-26 MED ORDER — HYDROXYZINE HCL 25 MG PO TABS: 25.0000 mg | ORAL_TABLET | Freq: Three times a day (TID) | ORAL | Status: AC | PRN

## 2015-11-26 MED ORDER — NICOTINE 21 MG/24HR TD PT24: 21.0000 mg | MEDICATED_PATCH | Freq: Every day | TRANSDERMAL | Status: AC

## 2015-11-26 MED ORDER — GABAPENTIN 400 MG PO CAPS: 400.0000 mg | ORAL_CAPSULE | Freq: Every day | ORAL | Status: AC

## 2015-11-26 MED ORDER — TEMAZEPAM 15 MG PO CAPS: 15.0000 mg | ORAL_CAPSULE | Freq: Every evening | ORAL | Status: AC | PRN

## 2015-11-26 MED ORDER — HYDRALAZINE HCL 10 MG PO TABS: 10.0000 mg | ORAL_TABLET | Freq: Every day | ORAL | Status: AC

## 2015-11-26 MED ORDER — FLUOXETINE HCL 10 MG PO CAPS: 10.0000 mg | ORAL_CAPSULE | Freq: Every day | ORAL | Status: AC

## 2015-11-26 NOTE — Progress Notes (Signed)
  [facility] Discharge [facility] :  Will you be returning to the same living situation after discharge:  No.  Stated plan is to go [place] [facility] [place] At discharge, do you have transportation home?: Yes,  bus pass provided Do you have the ability to pay for your medications: Yes,  got disability check today  Release of information consent forms completed and in the chart;  Patient's signature needed at discharge.  Patient to Follow up at: Follow-up Information    Follow up with [facility] .   Why:  Walk-in clinic Monday-Friday between 9am to 3pm for assessment for therapy and medication management services. Please arrive early in the morning in order to be seen as quickly as possible.    Contact information:   [address]  [place], [place] [address]  Phone: [phone]      [facility]   Specialty:  Behavioral Health   Why:  Walk-in clinic Monday-Friday between 8am to 3pm for assessment for therapy and medication management services if you decide to stay in [place]. Arrive early in the morning in order to be seen as quickly as possible.   Contact information:   [address] [place] [address] [phone]       Next level of care provider has access to [facility] Link:no  Safety Planning and Suicide Prevention discussed: Yes,  with patient only, refused with other  Have you used any form of tobacco in the last 30 days? (Cigarettes, Smokeless Tobacco, Cigars, and/or Pipes): Yes  Has patient been referred to the Quitline?: Patient refused referral  Patient has been referred for addiction treatment: Pt. refused referral  Sarina SerGrossman-Orr, Kamorah Nevils Jo 11/26/2015, 12:44 PM
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
## 2015-11-26 NOTE — [facility] Suicide Risk Assessment (Signed)
[facility] Discharge Suicide Risk Assessment   Principal Problem: Severe episode of recurrent major depressive disorder, without psychotic features [facility]) Discharge Diagnoses:  Patient Active Problem List   Diagnosis Date Noted  . Severe episode of recurrent major depressive disorder, without psychotic features (HCC) [F33.2]   . Major depressive disorder, recurrent episode, moderate (HCC) [F33.1] 11/13/2015  . Anxiety disorder [F41.9] 11/13/2015  . Encounter for preadmission testing [Z01.818]   . Insomnia [G47.00]   . Drug-seeking behavior [F19.10] 08/16/2015  . HTN (hypertension) [I10] 07/19/2015  . Substance induced mood disorder (HCC) [F19.94] 06/20/2015  . COPD (chronic obstructive pulmonary disease) (HCC) [J44.9] 01/19/2015  . Tobacco abuse [Z72.0] 01/12/2015  . COPD exacerbation (HCC) [J44.1] 01/05/2015  . Anxiety [F41.9] 01/05/2015  . 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 Polysubstance abuse [F19.10] 12/30/2014  . Benzodiazepine abuse [F13.10] 12/30/2014  . Mood disorder (HCC) [F39] 11/29/2014    Total Time spent with patient: 30 minutes  Musculoskeletal: Strength & Muscle Tone: within normal limits Gait & Station: normal Patient leans: N/A  Psychiatric Specialty Exam: Review of Systems  Psychiatric/Behavioral: Negative for depression and hallucinations. The patient is not nervous/anxious and does not have insomnia.   All other systems reviewed and are negative.   Blood pressure 117/76, pulse 78, temperature 97.6 F (36.4 C), temperature source Oral, resp. rate 14, height  (1.702 m), weight 55.792 kg (123 lb), SpO2 98 %.Body mass index is 19.26 kg/(m^2).  General Appearance: Fairly Groomed  Patent attorney::  Fair  Speech:  Clear and Coherent409  Volume:  Normal  Mood:  Euthymic  Affect:  Congruent  Thought Process:  Goal Directed  Orientation:  Full (Time, Place, and Person)  Thought Content:  WDL  Suicidal Thoughts:  No  Homicidal Thoughts:  No  Memory:   Immediate;   Fair Recent;   Fair Remote;   Fair  Judgement:  Fair  Insight:  Fair  Psychomotor Activity:  Normal  Concentration:  Fair  Recall:  [employer] of Knowledge:Fair  Language: Fair  Akathisia:  No  Handed:  Right  AIMS (if indicated):     Assets:  Desire for Improvement  Sleep:  Number of Hours: 6.75  Cognition: WNL  ADL's:  Intact   Mental Status Per Nursing Assessment::   On Admission:     Demographic Factors:  Caucasian  Loss Factors: NA  Historical Factors: NA  Risk Reduction Factors:   Positive therapeutic relationship  Continued Clinical Symptoms:  Alcohol/Substance Abuse/Dependencies Previous Psychiatric Diagnoses and Treatments  Cognitive Features That Contribute To Risk:  None    Suicide Risk:  Minimal: No identifiable suicidal ideation.  Patients presenting with no risk factors but with morbid ruminations; may be classified as minimal risk based on the severity of the depressive symptoms  Follow-up Information    Follow up with [facility] .   Why:  Walk-in clinic Monday-Friday between 9am to 3pm for assessment for therapy and medication management services. Please arrive early in the morning in order to be seen as quickly as possible.    Contact information:   [address]  [place], [place] [address]  Phone: [phone]      Follow up with [facility].   Specialty:  Behavioral Health   Why:  Walk-in clinic Monday-Friday between 8am to 3pm for assessment for therapy and medication management services if you decide to stay in [place]. Arrive early in the morning in order to be seen as quickly as possible.   Contact information:   [address] [place] [address] [phone]       Plan Of Care/Follow-up recommendations:  Activity:  no restrictions Diet:  regular Tests:  as needed Other:  follow up with aftercare  Jsean Taussig, MD 11/26/2015, 9:08 AM

## 2015-11-26 NOTE — Progress Notes (Signed)
Discharge note:  Patient discharged per MD order.  Patient received all personal belongings from locker and unit.  Patient was anxious this morning because she stated she was supposed to leave at 0800.  She was worried about her bus transportation.  She stated, "well, if I have to, I'll just stay until tomorrow."  Informed patient we would get her discharge as soon as provider arrived.  She left with prescriptions and copy of AVS.  AVS/discharge instructions were reviewed with patient and she indicated understanding.  [facility] [place] on Monday.  She states she is staying with a friend until then.  She denies SI/HI/AVH.  Patient left in good spirits.  She left with a bus ticket for the bus station.

## 2017-07-13 IMAGING — CR DG HIP (WITH OR WITHOUT PELVIS) 2-3V*R*
3 series · 3 of 3 positions shown · non-contrast
Comparison: None.

CLINICAL DATA: Right hip pain after fall.

EXAM:
DG HIP (WITH OR WITHOUT PELVIS) 2-3V RIGHT

[pelvis ap]
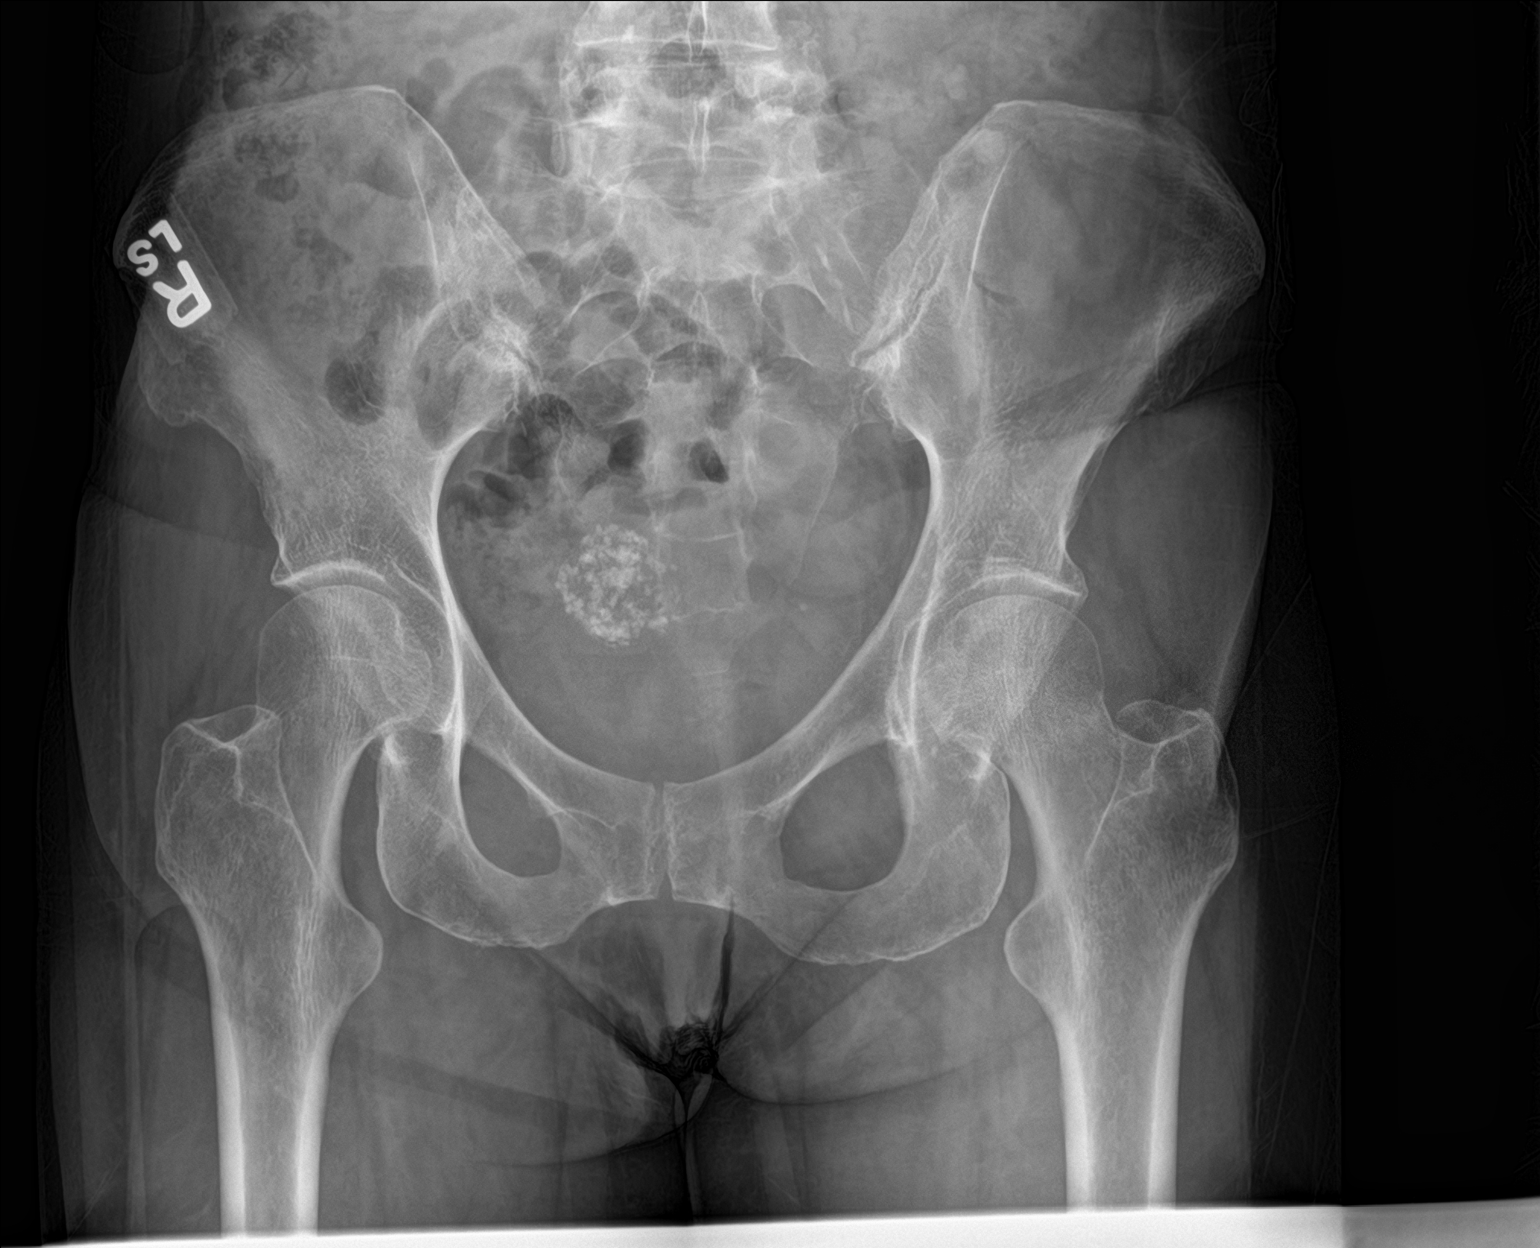

[hip ap]
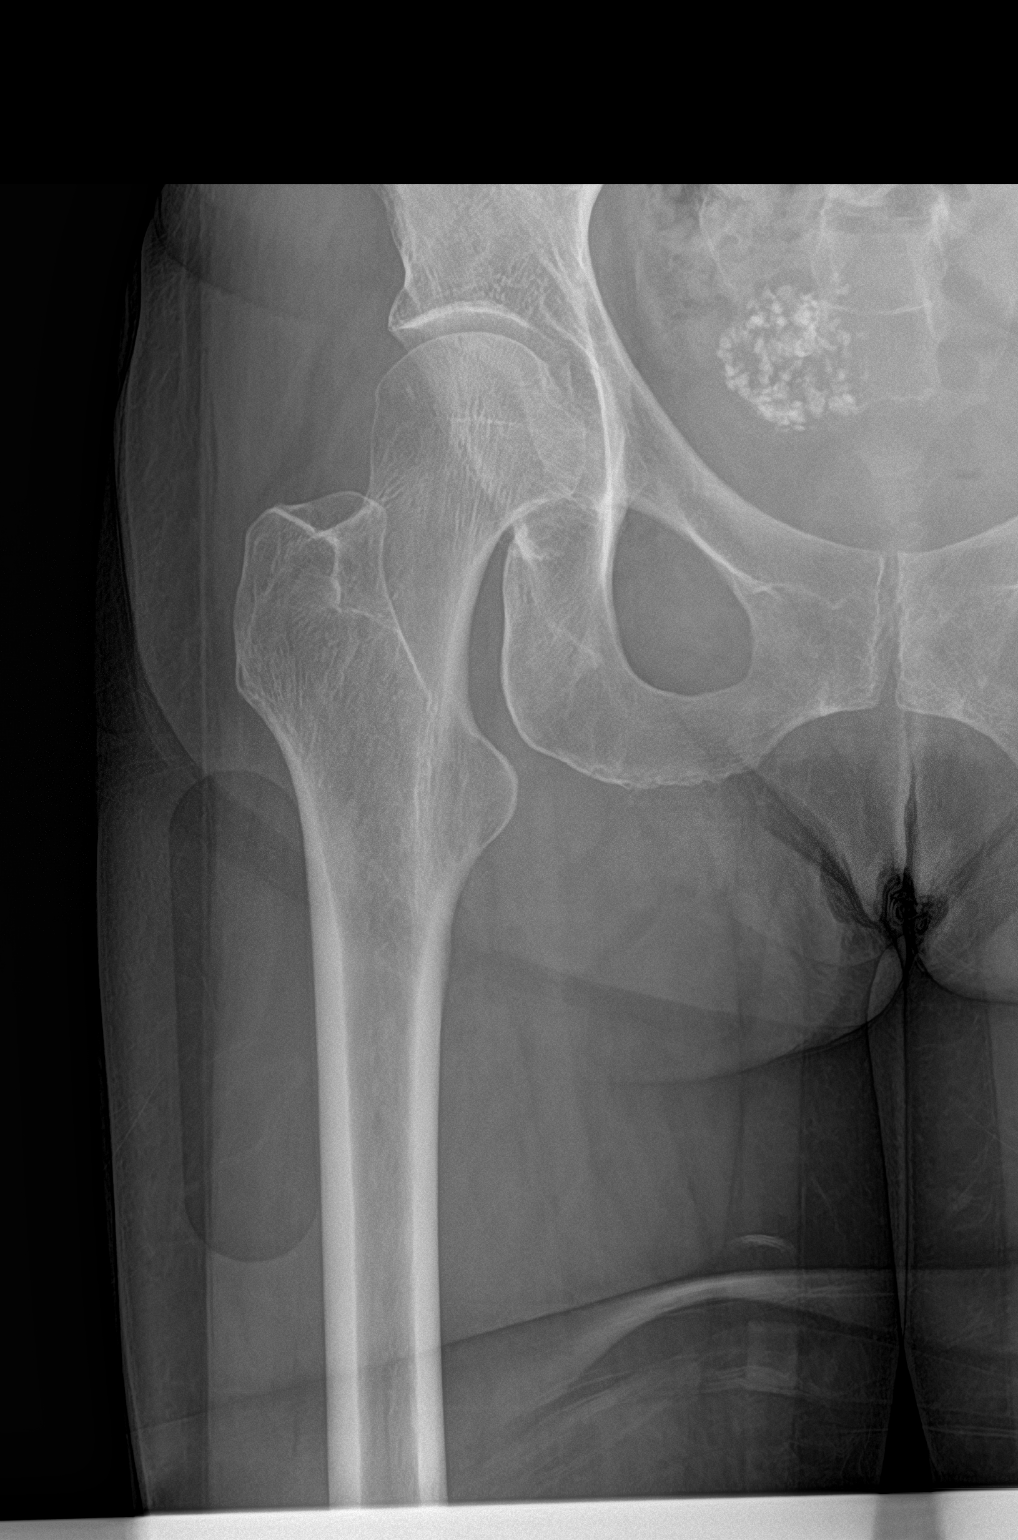

[hip lat]
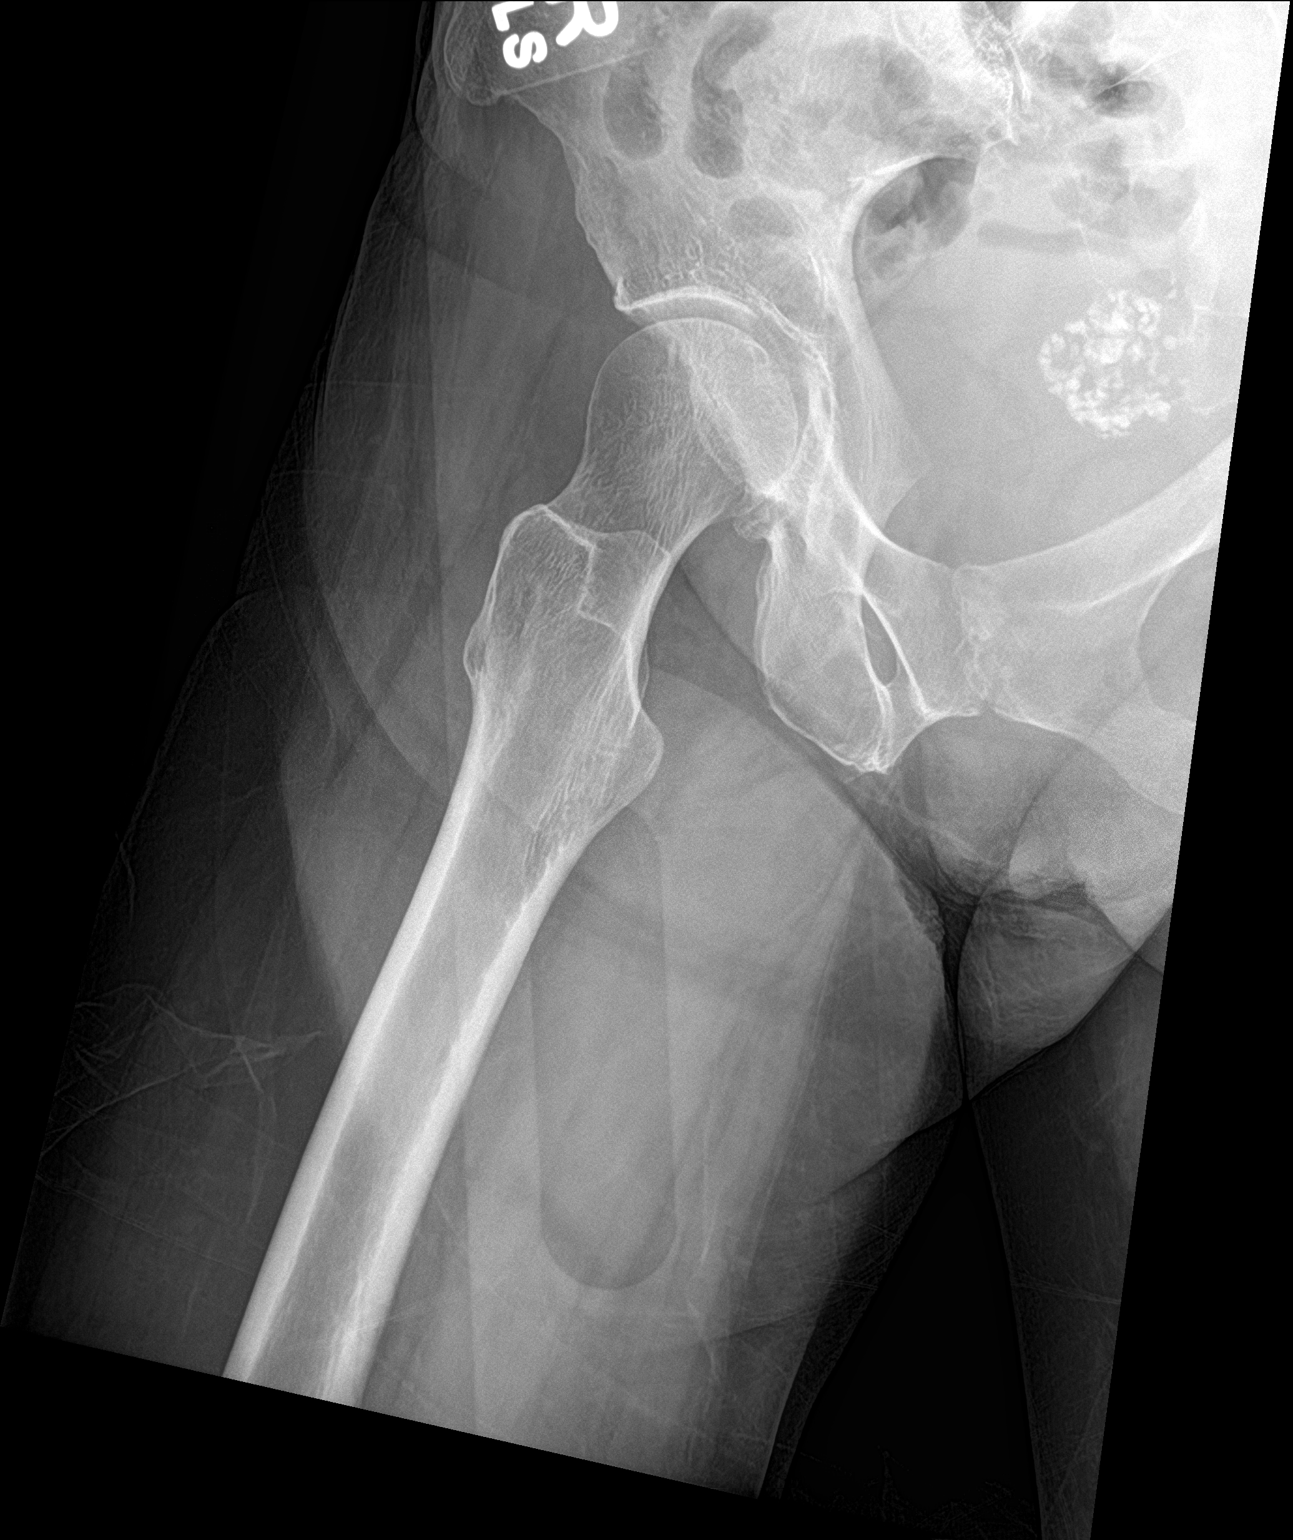

[3 of 3 positions shown; findings below may reference images not displayed]

FINDINGS: The cortical margins of the bony pelvis and right hip are intact. No
fracture. Degenerative change of both sacroiliac joints. Pubic
symphysis is normal. Both femoral heads are well-seated in the
respective acetabula. Rounded pelvic calcifications consistent with
uterine fibroid.
IMPRESSION: No acute bony abnormality. Mild degenerative change of the
sacroiliac joints.
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
